# Patient Record
Sex: Female | Born: 1970 | Race: White | Hispanic: No | Marital: Married | Smoking: Never smoker
Health system: Southern US, Community
[De-identification: ages and names within clinical notes are randomized; demographics above are authoritative.]

## PROBLEM LIST (undated history)

## (undated) DIAGNOSIS — F419 Anxiety disorder, unspecified: Secondary | ICD-10-CM

## (undated) DIAGNOSIS — R011 Cardiac murmur, unspecified: Secondary | ICD-10-CM

## (undated) DIAGNOSIS — L811 Chloasma: Secondary | ICD-10-CM

## (undated) DIAGNOSIS — N390 Urinary tract infection, site not specified: Secondary | ICD-10-CM

## (undated) DIAGNOSIS — T7840XA Allergy, unspecified, initial encounter: Secondary | ICD-10-CM

## (undated) DIAGNOSIS — N920 Excessive and frequent menstruation with regular cycle: Secondary | ICD-10-CM

## (undated) DIAGNOSIS — M1990 Unspecified osteoarthritis, unspecified site: Secondary | ICD-10-CM

## (undated) DIAGNOSIS — N870 Mild cervical dysplasia: Secondary | ICD-10-CM

## (undated) HISTORY — PX: BREAST SURGERY: SHX581

## (undated) HISTORY — DX: Chloasma: L81.1

## (undated) HISTORY — PX: FOOT SURGERY: SHX648

## (undated) HISTORY — DX: Excessive and frequent menstruation with regular cycle: N92.0

## (undated) HISTORY — DX: Unspecified osteoarthritis, unspecified site: M19.90

## (undated) HISTORY — PX: TONSILLECTOMY AND ADENOIDECTOMY: SUR1326

## (undated) HISTORY — DX: Anxiety disorder, unspecified: F41.9

## (undated) HISTORY — DX: Urinary tract infection, site not specified: N39.0

## (undated) HISTORY — DX: Mild cervical dysplasia: N87.0

## (undated) HISTORY — DX: Cardiac murmur, unspecified: R01.1

## (undated) HISTORY — PX: DILATION AND EVACUATION: SHX1459

## (undated) HISTORY — DX: Allergy, unspecified, initial encounter: T78.40XA

---

## 1997-12-10 ENCOUNTER — Ambulatory Visit (HOSPITAL_BASED_OUTPATIENT_CLINIC_OR_DEPARTMENT_OTHER): Admission: RE | Admit: 1997-12-10 | Discharge: 1997-12-10 | Payer: Self-pay | Admitting: Surgery

## 2000-01-13 ENCOUNTER — Emergency Department (HOSPITAL_COMMUNITY): Admission: EM | Admit: 2000-01-13 | Discharge: 2000-01-13 | Payer: Self-pay

## 2001-03-15 ENCOUNTER — Other Ambulatory Visit: Admission: RE | Admit: 2001-03-15 | Discharge: 2001-03-15 | Payer: Self-pay | Admitting: Obstetrics and Gynecology

## 2001-09-04 ENCOUNTER — Other Ambulatory Visit: Admission: RE | Admit: 2001-09-04 | Discharge: 2001-09-04 | Payer: Self-pay | Admitting: Obstetrics and Gynecology

## 2002-01-08 ENCOUNTER — Other Ambulatory Visit: Admission: RE | Admit: 2002-01-08 | Discharge: 2002-01-08 | Payer: Self-pay | Admitting: Obstetrics and Gynecology

## 2002-03-15 ENCOUNTER — Other Ambulatory Visit: Admission: RE | Admit: 2002-03-15 | Discharge: 2002-03-15 | Payer: Self-pay | Admitting: Obstetrics and Gynecology

## 2002-07-11 ENCOUNTER — Other Ambulatory Visit: Admission: RE | Admit: 2002-07-11 | Discharge: 2002-07-11 | Payer: Self-pay | Admitting: Obstetrics and Gynecology

## 2003-02-04 ENCOUNTER — Other Ambulatory Visit: Admission: RE | Admit: 2003-02-04 | Discharge: 2003-02-04 | Payer: Self-pay | Admitting: Obstetrics and Gynecology

## 2003-07-15 ENCOUNTER — Other Ambulatory Visit: Admission: RE | Admit: 2003-07-15 | Discharge: 2003-07-15 | Payer: Self-pay | Admitting: Obstetrics and Gynecology

## 2004-08-09 ENCOUNTER — Ambulatory Visit (HOSPITAL_COMMUNITY): Admission: RE | Admit: 2004-08-09 | Discharge: 2004-08-09 | Payer: Self-pay | Admitting: Gastroenterology

## 2004-08-17 ENCOUNTER — Other Ambulatory Visit: Admission: RE | Admit: 2004-08-17 | Discharge: 2004-08-17 | Payer: Self-pay | Admitting: Addiction Medicine

## 2005-02-01 ENCOUNTER — Other Ambulatory Visit: Admission: RE | Admit: 2005-02-01 | Discharge: 2005-02-01 | Payer: Self-pay | Admitting: Gynecology

## 2005-09-02 ENCOUNTER — Inpatient Hospital Stay (HOSPITAL_COMMUNITY): Admission: AD | Admit: 2005-09-02 | Discharge: 2005-09-06 | Payer: Self-pay | Admitting: Gynecology

## 2005-10-19 ENCOUNTER — Other Ambulatory Visit: Admission: RE | Admit: 2005-10-19 | Discharge: 2005-10-19 | Payer: Self-pay | Admitting: Gynecology

## 2006-10-23 ENCOUNTER — Other Ambulatory Visit: Admission: RE | Admit: 2006-10-23 | Discharge: 2006-10-23 | Payer: Self-pay | Admitting: Gynecology

## 2007-04-11 ENCOUNTER — Ambulatory Visit (HOSPITAL_COMMUNITY): Admission: AD | Admit: 2007-04-11 | Discharge: 2007-04-11 | Payer: Self-pay | Admitting: Obstetrics and Gynecology

## 2007-04-11 ENCOUNTER — Encounter: Payer: Self-pay | Admitting: Gynecology

## 2007-04-11 IMAGING — US US OB TRANSVAGINAL
1 series · 14 of 28 positions shown · non-contrast
Comparison: None.

CLINICAL DATA: 37-year-old female with bleeding.  
TRANSVAGINAL OBSTETRICAL US:
TECHNIQUE: Transvaginal ultrasound was performed for evaluation of the gestation as well as the maternal uterus and adnexal regions.

[Series 1: us ob transvaginal · 14 of 30 slices shown]
[im 2/30]
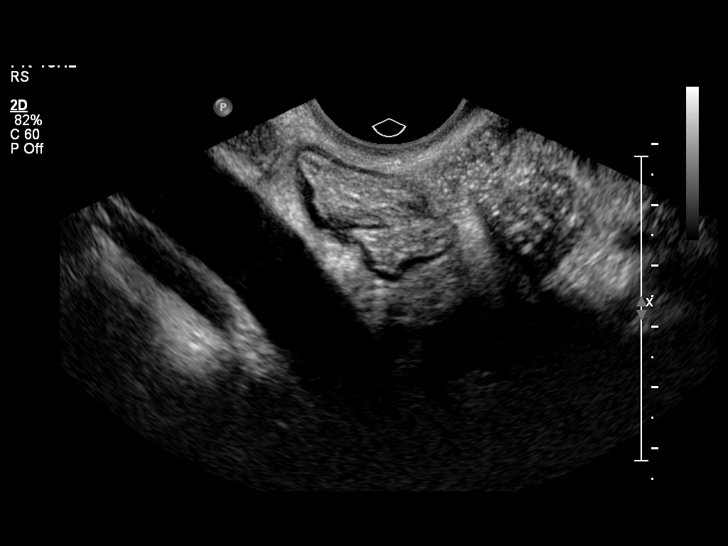
[im 4/30]
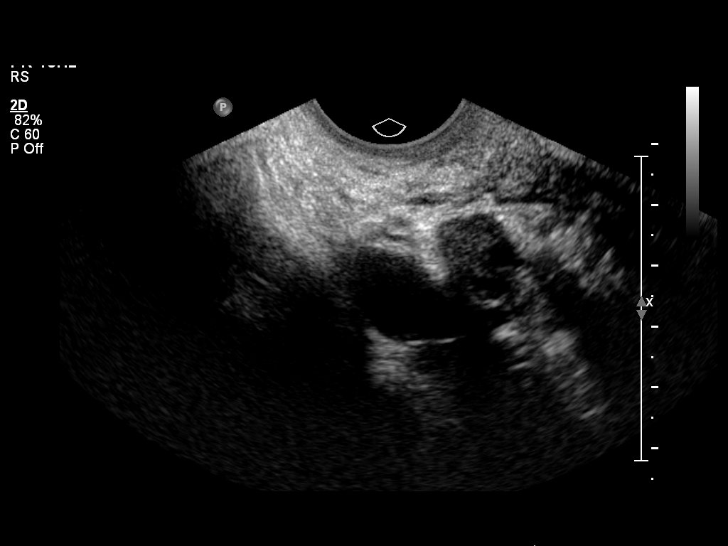
[im 6/30]
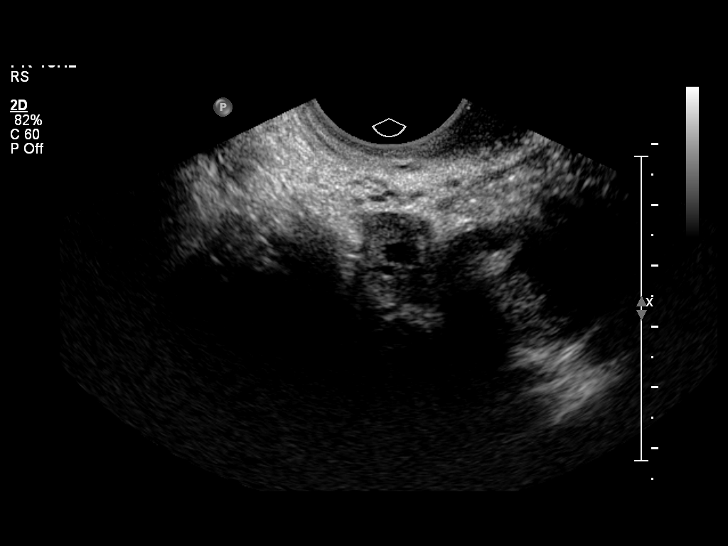
[im 8/30]
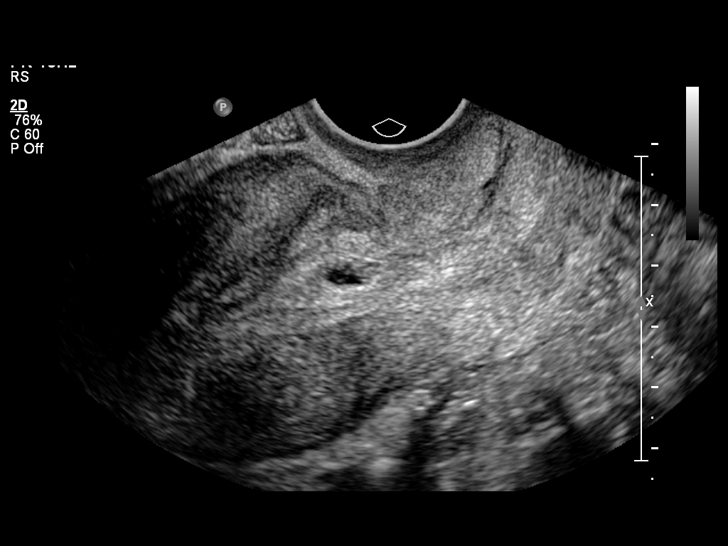
[im 10/30]
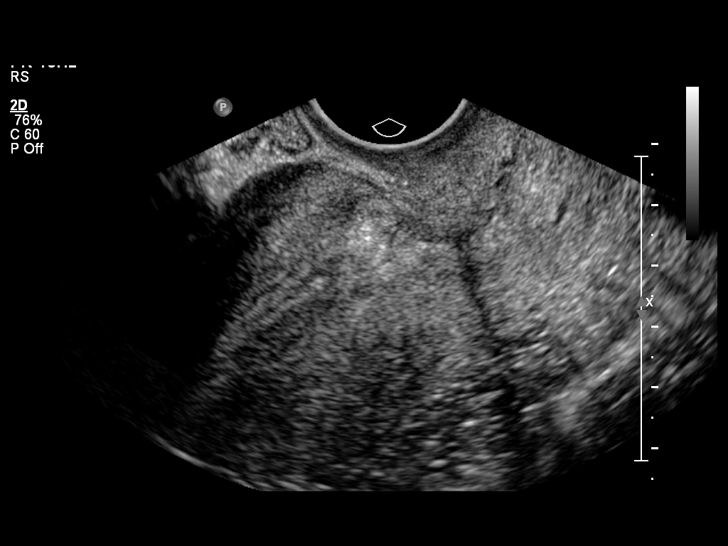
[im 12/30]
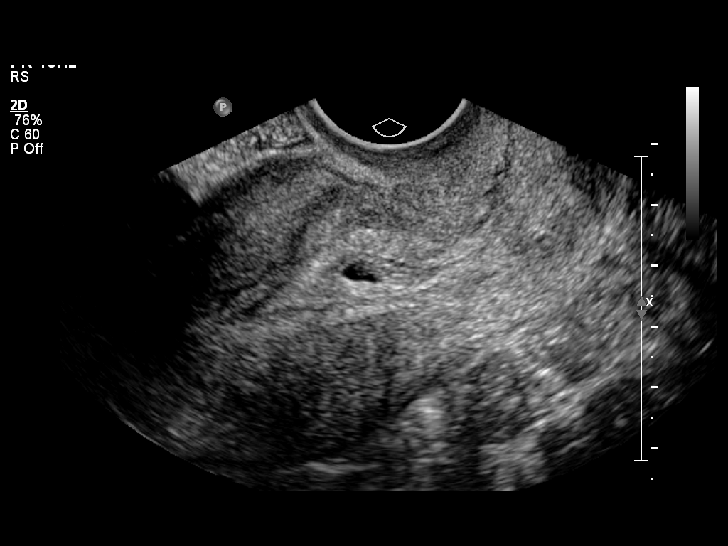
[im 14/30]
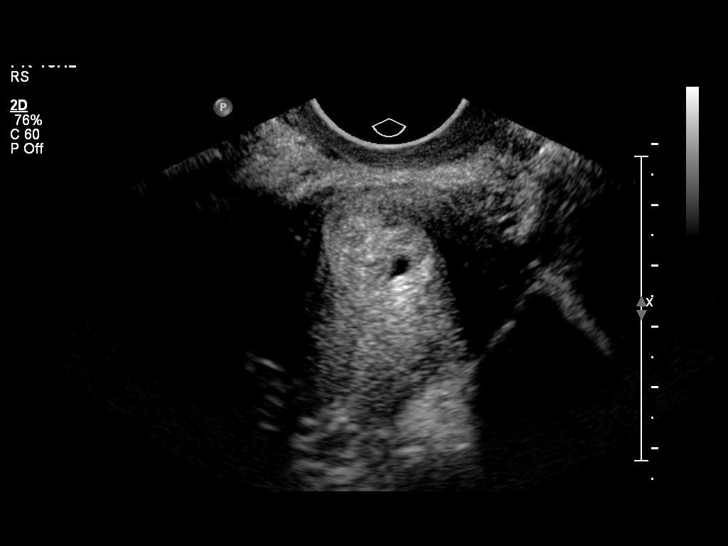
[im 17/30]
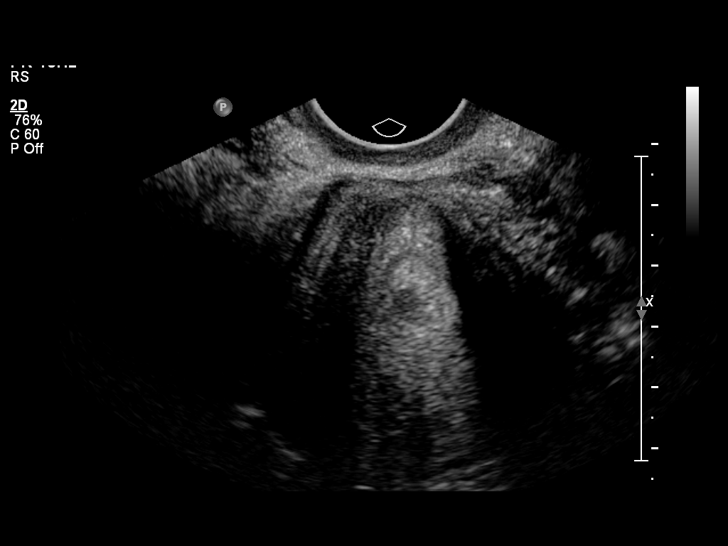
[im 19/30]
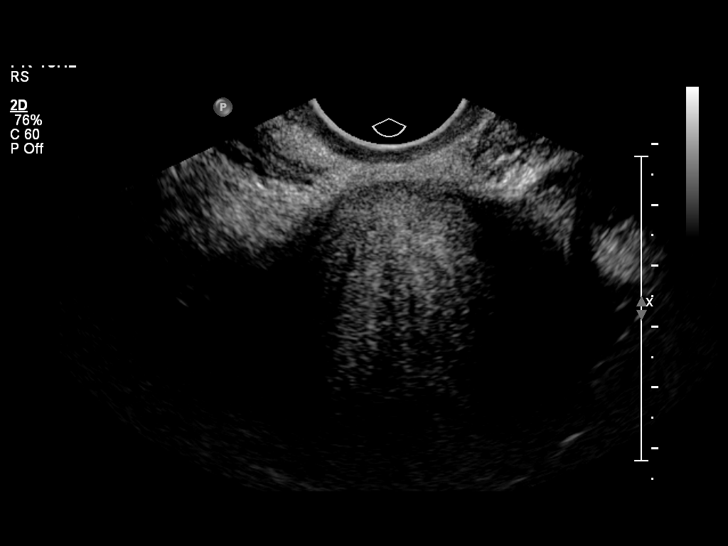
[im 21/30]
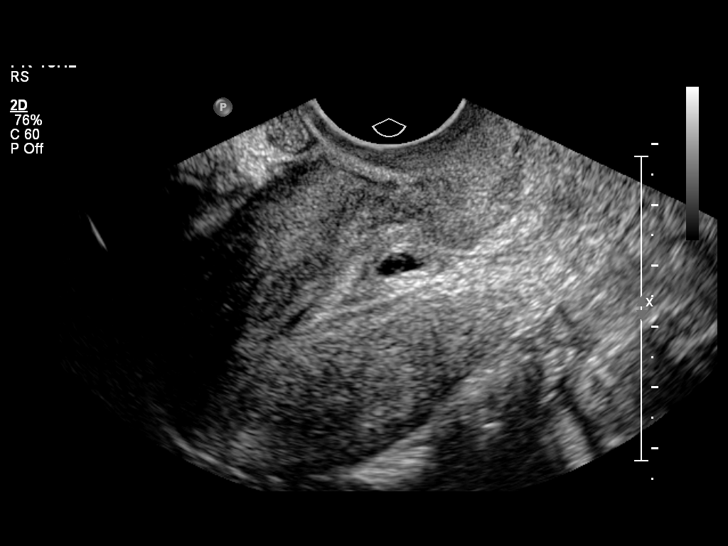
[im 23/30]
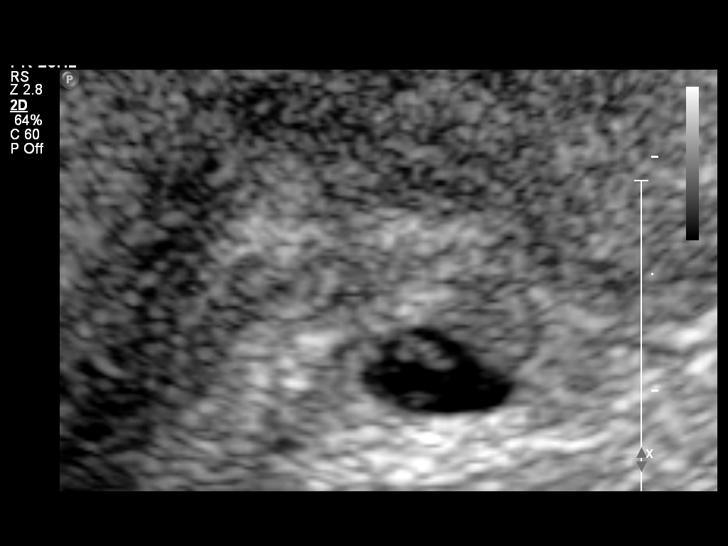
[im 25/30]
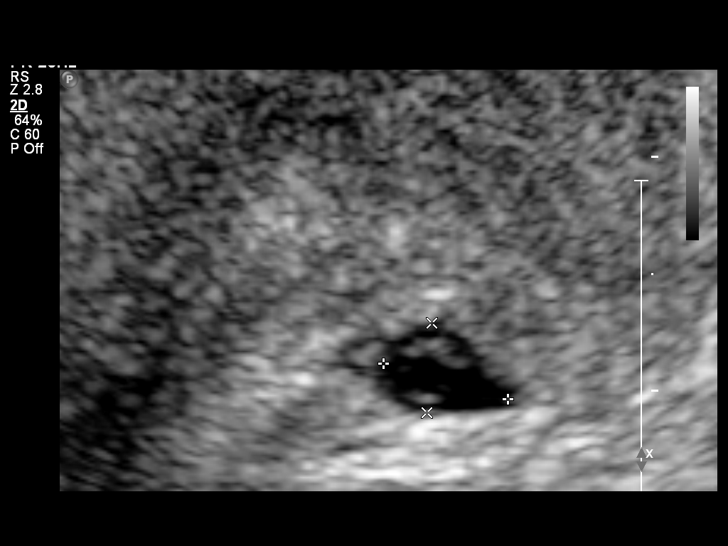
[im 27/30]
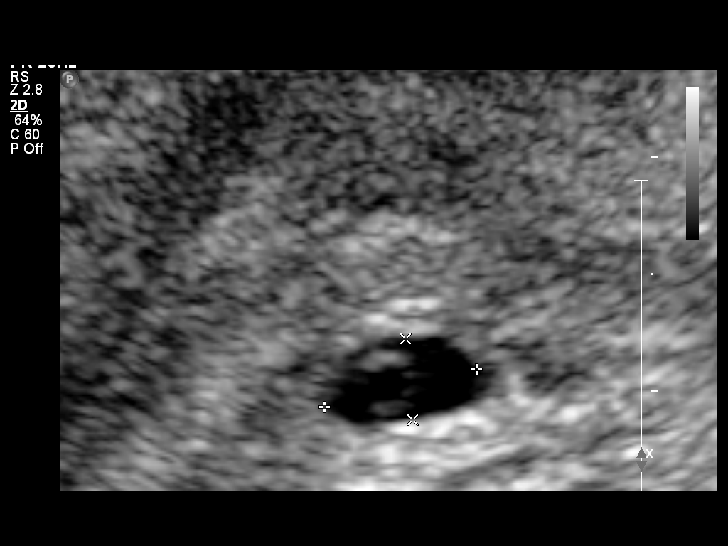
[im 30/30]
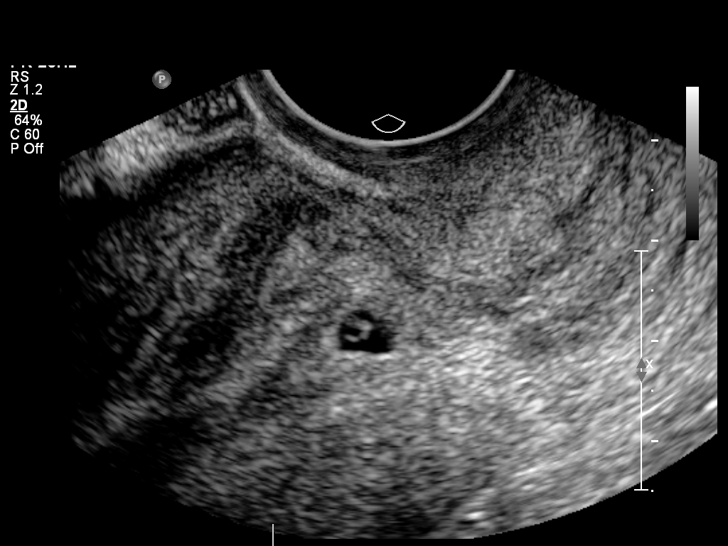

[14 of 28 positions shown; findings below may reference images not displayed]

FINDINGS: The right ovary is normal measuring 1.1 x 1.6 x 1.3 cm.  The left ovary is not visualized.  The uterus contains a single intrauterine gestational sac.  A yolk sac is identified.  Mean sac diameter is 4.5 mm correlating with an estimated gestational age of 5 weeks 0 days.  The gestational sac is located within the mid to lower uterine segment.  No embryo or cardiac activity.  No free fluid is identified.
IMPRESSION: Single intrauterine pregnancy with estimated gestational age of 5 weeks 0 days by mean sac diameter.

## 2007-10-30 ENCOUNTER — Encounter: Payer: Self-pay | Admitting: Gynecology

## 2007-10-30 ENCOUNTER — Other Ambulatory Visit: Admission: RE | Admit: 2007-10-30 | Discharge: 2007-10-30 | Payer: Self-pay | Admitting: Gynecology

## 2007-10-30 ENCOUNTER — Ambulatory Visit: Payer: Self-pay | Admitting: Gynecology

## 2008-10-16 ENCOUNTER — Ambulatory Visit: Payer: Self-pay | Admitting: Gynecology

## 2008-11-04 ENCOUNTER — Ambulatory Visit: Payer: Self-pay | Admitting: Gynecology

## 2008-11-04 ENCOUNTER — Encounter: Payer: Self-pay | Admitting: Gynecology

## 2008-11-04 ENCOUNTER — Other Ambulatory Visit: Admission: RE | Admit: 2008-11-04 | Discharge: 2008-11-04 | Payer: Self-pay | Admitting: Gynecology

## 2009-05-05 ENCOUNTER — Ambulatory Visit: Payer: Self-pay | Admitting: Gynecology

## 2009-05-05 ENCOUNTER — Other Ambulatory Visit: Admission: RE | Admit: 2009-05-05 | Discharge: 2009-05-05 | Payer: Self-pay | Admitting: Gynecology

## 2010-04-26 ENCOUNTER — Other Ambulatory Visit: Payer: Self-pay

## 2010-07-13 NOTE — Op Note (Signed)
NAME:  Gina Valentine, Gina Valentine            ACCOUNT NO.:  [id number]   MEDICAL RECORD NO.:  [id number]          PATIENT TYPE:  AMB   LOCATION:  SDC                           [facility]ILITY:  [facility]   PHYSICIAN:  Juan H. Lily Peer, M.D.DATE OF BIRTH:  1970/10/05   DATE OF PROCEDURE:  DATE OF DISCHARGE:                               OPERATIVE REPORT   HISTORY:  The patient is a 40 year old, gravida 2, para 1, now AB 1, at  7-1/2 weeks estimated gestational age who has been followed in the  office with quantitative beta-hCGs and ultrasounds.  The quantitative  beta-hCGs have been increasing but not at expected rate.  The patient  this morning presented to the emergency room with abdominal cramping and  vaginal bleeding, and was found to have tissue at the cervical os as  well as on the ultrasound.  The patient will be taken to the operating  room for D and E for a first trimester missed AB.   SURGEON:  Juan H. Lily Peer, M.D.   ANESTHESIA:  General endotracheal anesthesia.   PREOPERATIVE DIAGNOSIS:  First trimester missed abortion.   POSTOPERATIVE DIAGNOSIS:  First trimester missed abortion.   PROCEDURE:  Dilatation and evacuation.   FINDINGS:  A 6- to 8-week sized uterus, slightly anteverted, and  products of conception present at the external cervical os which was  dilated.   DESCRIPTION OF OPERATION:  After the patient was adequately counseled,  she was taken to the operative room where she underwent a MAC anesthesia  along with a paracervical block consisting of 2% lidocaine with  1:100,000 epinephrine for a total of 10 mL.  The patient was placed in  low lithotomy position.  The vagina and perineum were prepped and draped  in the usual sterile fashion.  Red Rubber Roxan Hockey was inserted into the  bladder to evacuate its contents for approximately 50 cc.  A weighted  speculum was placed into the vagina after 2% lidocaine was infiltrated  to the cervical stroma at the 2, 4, 8, and 10 o'clock  position.  The  uterus was sounded to about 8.5 cm and the cervix was serially dilated  with Shawnie Pons dilator in an effort to allow a 10 mm suction curette to be  introduced into the intrauterine cavity to remove the products of  conception.  The single-tooth tenaculum was removed.  The patient was  transferred to the recovery room with stable vital signs.  Blood loss  was minimal.  Fluid resuscitation consisted of a liter of lactated  Ringer's, and she received 1 g of  cefoxitin for prophylaxis and 10  units of Pitocin were placed in a liter of LR.  The patient's blood type  is A positive and was transferred to the recovery room with stable vital  signs.      Juan H. Lily Peer, M.D.  Electronically Signed    JHF/MEDQ  D:  04/11/2007  T:  04/11/2007  Job:  [id number]

## 2010-07-16 NOTE — Discharge Summary (Signed)
NAME:  Gina Valentine, Gina Valentine            ACCOUNT NO.:  [id number]   MEDICAL RECORD NO.:  [id number]          PATIENT TYPE:  INP   LOCATION:  9131                          [facility]ACILITY:  [facility]   PHYSICIAN:  Timothy P. Fontaine, M.D.DATE OF BIRTH:  September 20, 1970   DATE OF ADMISSION:  09/02/2005  DATE OF DISCHARGE:  09/06/2005                                 DISCHARGE SUMMARY   DISCHARGE DIAGNOSES:  1.  Pregnancy at term.  2.  Failed induction.  3.  Suspected cephalopelvic disproportion.   PROCEDURE:  Primary low transverse cervical cesarean section September 03, 2005  Dr. Reynaldo Minium.   HOSPITAL COURSE:  A 40 year old G1 P0 female at [redacted] weeks gestation admitted  for induction per Dr. Lily Peer.  Patient underwent serial induction  initially with Pitocin, subsequent Cervidil, and subsequent Pitocin the  following day.  Remained unchanged and ultimately underwent a primary low  transverse cervical cesarean section September 03, 2005 for suspected CPD, failed  induction producing a normal female infant Apgars 8/9, weight 7 pounds 13  ounces.  Patient's postoperative course was uncomplicated and she was  discharged on postoperative day #3 and ambulating well, tolerating a regular  diet with a postoperative hemoglobin of 10.8.  Patient received precautions,  instructions, and follow-up and will be seen in the office six weeks  following delivery.  Blood type A+, rubella titer positive.  Received a  prescription for Tylox #25 one to two p.o. q.4-6h. p.r.n. pain.      Timothy P. Fontaine, M.D.  Electronically Signed     TPF/MEDQ  D:  09/06/2005  T:  09/06/2005  Job:  [id number]

## 2010-07-16 NOTE — Op Note (Signed)
NAME:  Gina Valentine, Gina Valentine            ACCOUNT NO.:  [id number]   MEDICAL RECORD NO.:  [id number]          PATIENT TYPE:  INP   LOCATION:  9131                          [facility]   PHYSICIAN:  Juan H. Lily Peer, M.D.DATE OF BIRTH:  02/23/1971   DATE OF PROCEDURE:  09/03/2005  DATE OF DISCHARGE:                                 OPERATIVE REPORT   INDICATIONS FOR OPERATION:  A 40 year old gravida 1, para 0, at 40-1/2  weeks' estimated gestational age, had been brought into the hospital on July  6 for induction.  She has received Cervidil for 1 day and Pitocin x2 with  minimal change in her cervix, still remained a 1 cm 70% effaced, -3 station,  but reassuring fetal heart rate tracing.   PREOPERATIVE DIAGNOSES:  1.  Post-date pregnancy.  2.  Failed induction.  3.  Suspect cephalopelvic disproportion versus large for gestational age.   POSTOPERATIVE DIAGNOSES:  1.  Post-date pregnancy.  2.  Failed induction.  3.  Suspect cephalopelvic disproportion versus large for gestational age.   SURGEON:  Juan H. Lily Peer, M.D.   ANESTHESIA:  Epidural.   PROCEDURE PERFORMED:  Primary lower uterine segment transverse cesarean  section.   FINDINGS:  A viable female infant, Apgars of 8 and 9.  There was light  meconium in the amniotic fluid.  Weight 7 pounds 13 ounces.  Arterial cord  pH 7.30.  Normal with normal maternal pelvic anatomy.   DESCRIPTION OF OPERATION:  After the patient was adequately counseled she  was taken to the operating room, where she was redosed through her epidural  catheter.  She had a reassuring fetal heart rate tracing before taking back  to the operating room.  The abdomen was prepped and draped in the usual  sterile fashion.  A Foley catheter was already in place for monitoring of  urinary output.  A Pfannenstiel skin incision was then made 2 cm above the  symphysis pubis and the skin incision was carried down through the skin and  subcutaneous tissue down to  the rectus fascia, whereby a midline nick was  made.  The fascia was incised in transverse fashion.  The midline raphe was  entered.  The peritoneal cavity was entered cautiously.  The bladder flap  was established and the lower uterine segment was incised in a transverse  fashion.  Light meconium-stained amniotic fluid was evident.  Due to the  size of eight the baby's head in comparison to the incision, the Tender  Touch vacuum was utilized with minimal traction to assist in the delivery.  The nasopharyngeal area was then bulb suctioned with DeLee suction.  The  newborn was delivered, gave an immediate cry.  The cord was doubly clamped  and excised, shown to the parents and passed off to the neonatologists who  were in attendance, who gave the above-mentioned parameters.  After cord  blood was obtained, the placenta was delivered from the intrauterine cavity.  Pitocin was started and the patient received a gram of Ancef IV for  prophylaxis.  The uterus was then exteriorized.  The intrauterine cavity was  swept  clear of remaining products of conception.  The lower uterine segment  was closed in a double-layer fashion, first with an interlocking stitch of 0  Vicryl suture, followed by a second layer with 0 Vicryl suture in an  imbricating fashion.  After inspection of the uterus and tubes and ovaries  appeared normal and good hemostasis and sponge count and needle count was  correct, the uterus was placed in the pelvic cavity after copiously  irrigating with normal saline solution.  The visceral peritoneum was not  reapproximated, but the rectus fascia was closed with a running stitch of 0  Vicryl suture.  The subcutaneous bleeders were Bovie cauterized.  The skin  was reapproximated with skin clips, followed by placement of Xeroform gauze  and 4 x 8 dressing.  Of note, 3% Nesacaine was sprinkled into the peritoneum  due to the fact that the patient was nauseated and tender, for  approximately  20 mL.  After the skin was reapproximated with skin clips, the incision was  covered with Xeroform gauze followed by placement of 4 x 8 dressing.  The  patient was transferred to the recovery with stable vital signs.  Blood loss  for the procedure was 900 mL.  IV fluid was 2 L of LR, and urine output was  350 mL.      Juan H. Lily Peer, M.D.  Electronically Signed     JHF/MEDQ  D:  09/03/2005  T:  09/03/2005  Job:  [id number]

## 2010-07-16 NOTE — Op Note (Signed)
NAME:  Gina Valentine, Gina Valentine NO.:  [id number]   MEDICAL RECORD NO.:  [id number]          PATIENT TYPE:  AMB   LOCATION:  ENDO                         [facility]ACILITY:  [facility]   PHYSICIAN:  Graylin Shiver, M.D.   DATE OF BIRTH:  December 18, 1970   DATE OF PROCEDURE:  08/09/2004  DATE OF DISCHARGE:                                 OPERATIVE REPORT   PROCEDURE:  Colonoscopy.   INDICATIONS:  Rectal bleeding.   Informed consent was obtained after explanation of the risks of bleeding,  infection, and perforation.   PREMEDICATION:  1.  Fentanyl 75 mcg IV.  2.  Versed 6 mg IV.   PROCEDURE:  With the patient in the left lateral decubitus position, a  rectal exam was performed.  No masses were felt.  The Olympus colonoscope  was inserted into the rectum and advanced around the colon to the cecum.  Cecal landmarks were identified.  The cecum and ascending colon were normal.  The transverse colon normal.  The descending colon, sigmoid, and rectum were  normal.  The scope was retroflexed in the rectum.  No specific abnormalities  were seen.  The scope was straightened and brought out.  She tolerated the  procedure well without complications.   IMPRESSION:  Normal colonoscopy to the cecum.       SFG/MEDQ  D:  08/09/2004  T:  08/09/2004  Job:  [id number]   cc:   Reuel Boom L. Eda Paschal, M.D.  [address], [address]  [place]  [place] [address]  Fax: [phone]

## 2010-07-16 NOTE — H&P (Signed)
NAME:  Gina Valentine, Gina Valentine NO.:  [id number]   MEDICAL RECORD NO.:  [id number]          PATIENT TYPE:  MAT   LOCATION:  MATC                          [facility]   PHYSICIAN:  Juan H. Lily Peer, M.D.DATE OF BIRTH:  [birth year]   DATE OF ADMISSION:  09/01/2005  DATE OF DISCHARGE:                                HISTORY & PHYSICAL   CHIEF COMPLAINT:  Post date pregnancy.   HISTORY:  The patient is a 40 year old gravida 1, para 0, with a last  menstrual period of November 22, 2004, with estimated date of confinement  August 29, 2005, currently 40.[redacted] weeks gestation.  The patient was seen in the  office on September 01, 2005.  Her cervix was 1 cm, 50%, -3 station.  Ultrasound  demonstrated a fetus weighing 8 pounds, 8 ounces, in the vertex  presentation, with an AFI of 11.8 and cardiac activity of 143 beats per  minute.  The patient had declined first trimester screening, and declined  cystic fibrosis screening, and otherwise had an uneventful prenatal course  with the exception of an episode of epistaxis, a result of a nasal polyp, at  [redacted] weeks gestation.   PAST MEDICAL HISTORY:  The patient conceived with artificial insemination  and superovulation with __________.  She denies any allergies and no other  medical problems.   REVIEW OF SYSTEMS:  See hollister form.   PHYSICAL EXAMINATION:  VITAL SIGNS:  The patient weighs 208 pounds.  Blood  pressure 118/70.  Urine was negative for protein and glucose.  HEENT:  Unremarkable.  NECK:  Supple.  Trachea midline.  No carotid bruits.  No thyromegaly.  LUNGS:  Clear to auscultation without rhonchi or wheezes.  HEART:  Regular rate and rhythm.  No murmurs or gallops.  BREAST EXAM:  Not done.  ABDOMEN:  Soft, nontender, without rebound or guarding.  Fundal height 40  cm.  PELVIC:  Cervix 1 cm, 50% effaced, -3.  EXTREMITIES:  DTRs 1+.  Negative clonus.  Trace edema.   PRENATAL LABORATORIES:  A positive blood type.  Negative  antibody screen.  VDRL was nonreactive.  Rubella immune.  Hepatitis B surface antigen and HIV  were negative.  Alpha-fetoprotein was normal.  Diabetes screen was normal.  GBS culture was negative.  Declined cystic fibrosis screening.  Declined  first trimester screening.   ASSESSMENT:  A 40 year old, gravida 1, para 0, at 40.5 weeks estimated  gestational age, post dates at 97.[redacted] weeks gestation, being admitted on the  evening of September 01, 2005 for cervical ripening with Cervidil, followed by  Pitocin augmentation on the following morning.  Her GBS status was negative.  The risks, benefits, and pros and cons of  induction were discussed with the patient, and the potential for __________  induction were discussed, as well.  All questions were answered, and will  follow accordingly.   PLAN:  Induction with Cervidil the evening of September 01, 2005, followed by  Pitocin on the morning of September 02, 2005.      Juan H. Lily Peer, M.D.  Electronically Signed     JHF/MEDQ  D:  09/01/2005  T:  09/01/2005  Job:  [id number]

## 2010-07-19 ENCOUNTER — Other Ambulatory Visit: Payer: Self-pay | Admitting: Gynecology

## 2010-07-19 ENCOUNTER — Other Ambulatory Visit (HOSPITAL_COMMUNITY)
Admission: RE | Admit: 2010-07-19 | Discharge: 2010-07-19 | Disposition: A | Discharge status: Home / Self Care | Payer: 59 | Source: Ambulatory Visit | Attending: Gynecology | Admitting: Gynecology

## 2010-07-19 ENCOUNTER — Encounter (INDEPENDENT_AMBULATORY_CARE_PROVIDER_SITE_OTHER): Payer: 59 | Admitting: Gynecology

## 2010-07-19 DIAGNOSIS — Z13220 Encounter for screening for lipoid disorders: Secondary | ICD-10-CM

## 2010-07-19 DIAGNOSIS — R635 Abnormal weight gain: Secondary | ICD-10-CM

## 2010-07-19 DIAGNOSIS — Z124 Encounter for screening for malignant neoplasm of cervix: Secondary | ICD-10-CM | POA: Insufficient documentation

## 2010-07-19 DIAGNOSIS — Z01419 Encounter for gynecological examination (general) (routine) without abnormal findings: Secondary | ICD-10-CM

## 2010-07-19 DIAGNOSIS — N949 Unspecified condition associated with female genital organs and menstrual cycle: Secondary | ICD-10-CM

## 2010-07-21 ENCOUNTER — Other Ambulatory Visit: Payer: 59

## 2010-07-21 ENCOUNTER — Ambulatory Visit (INDEPENDENT_AMBULATORY_CARE_PROVIDER_SITE_OTHER): Payer: 59 | Admitting: Gynecology

## 2010-07-21 DIAGNOSIS — N949 Unspecified condition associated with female genital organs and menstrual cycle: Secondary | ICD-10-CM

## 2010-07-21 DIAGNOSIS — N831 Corpus luteum cyst of ovary, unspecified side: Secondary | ICD-10-CM

## 2010-07-21 DIAGNOSIS — N921 Excessive and frequent menstruation with irregular cycle: Secondary | ICD-10-CM

## 2010-11-19 LAB — DIFFERENTIAL
Basophils Relative: 0
Eosinophils Absolute: 0.1
Monocytes Absolute: 0.6
Monocytes Relative: 7
Neutrophils Relative %: 60

## 2010-11-19 LAB — CBC
MCHC: 34.1
MCV: 89.7
RBC: 3.97
RDW: 13.2

## 2010-11-19 LAB — ABO/RH: ABO/RH(D): A POS

## 2011-06-01 ENCOUNTER — Other Ambulatory Visit: Payer: Self-pay | Admitting: Physician Assistant

## 2012-05-31 ENCOUNTER — Encounter: Payer: Self-pay | Admitting: Gynecology

## 2012-06-05 ENCOUNTER — Ambulatory Visit (INDEPENDENT_AMBULATORY_CARE_PROVIDER_SITE_OTHER): Payer: 59 | Admitting: Gynecology

## 2012-06-05 ENCOUNTER — Encounter: Payer: Self-pay | Admitting: Gynecology

## 2012-06-05 VITALS — BP 126/78 | Ht 65.5 in | Wt 154.0 lb

## 2012-06-05 DIAGNOSIS — Z01419 Encounter for gynecological examination (general) (routine) without abnormal findings: Secondary | ICD-10-CM

## 2012-06-05 DIAGNOSIS — R635 Abnormal weight gain: Secondary | ICD-10-CM

## 2012-06-05 LAB — CBC WITH DIFFERENTIAL/PLATELET
Eosinophils Absolute: 0.2 10*3/uL (ref 0.0–0.7)
Eosinophils Relative: 4 % (ref 0–5)
Hemoglobin: 11.6 g/dL — ABNORMAL LOW (ref 12.0–15.0)
Lymphs Abs: 2.3 10*3/uL (ref 0.7–4.0)
MCH: 29.3 pg (ref 26.0–34.0)
MCV: 87.1 fL (ref 78.0–100.0)
Monocytes Relative: 11 % (ref 3–12)
RBC: 3.96 MIL/uL (ref 3.87–5.11)

## 2012-06-05 NOTE — Patient Instructions (Addendum)

## 2012-06-05 NOTE — Progress Notes (Signed)
Gina Valentine [birth year] [id number]   History:    42 y.o.  for annual gyn exam who is been seen the office for 2 years. 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 Her husband has had a vasectomy. She's having regular cycles. 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 Her last mammogram was in 2012 which was normal. Patient does not recall ever receiving the Tdap vaccine.  Past medical history,surgical history, family history and social history were all reviewed and documented in the EPIC chart.  Gynecologic History Patient's last menstrual period was 05/22/2012. Contraception: vasectomy Last Pap: 2012. Results were: normal Last mammogram: 2012. Results were: normal  Obstetric History OB History   Grav Para Term Preterm Abortions TAB SAB Ect Mult Living   2 1   1  1   1      # Outc Date GA Lbr Len/2nd Wgt Sex Del Anes PTL Lv   1 PAR            2 SAB                ROS: A ROS was performed and pertinent positives and negatives are included in the history.  GENERAL: No fevers or chills. HEENT: No change in vision, no earache, sore throat or sinus congestion. NECK: No pain or stiffness. CARDIOVASCULAR: No chest pain or pressure. No palpitations. PULMONARY: No shortness of breath, cough or wheeze. GASTROINTESTINAL: No abdominal pain, nausea, vomiting or diarrhea, melena or bright red blood per rectum. GENITOURINARY: No urinary frequency, urgency, hesitancy or dysuria. MUSCULOSKELETAL: No joint or muscle pain, no back pain, no recent trauma. DERMATOLOGIC: No rash, no itching, no lesions. ENDOCRINE: No polyuria, polydipsia, no heat or cold intolerance. No recent change in weight. HEMATOLOGICAL: No anemia or easy bruising or bleeding. NEUROLOGIC: No headache, seizures, numbness, tingling or weakness. PSYCHIATRIC: No depression, no loss of interest in normal activity or change in  sleep pattern.     Exam: chaperone present  BP 126/78  Ht 5' 5.5" (1.664 m)  Wt 154 lb (69.854 kg)  BMI 25.23 kg/m2  LMP 05/22/2012  Body mass index is 25.23 kg/(m^2).  General appearance : Well developed well nourished female. No acute distress HEENT: Neck supple, trachea midline, no carotid bruits, no thyroidmegaly Lungs: Clear to auscultation, no rhonchi or wheezes, or rib retractions  Heart: Regular rate and rhythm, no murmurs or gallops Breast:Examined in sitting and supine position were symmetrical in appearance, no palpable masses or tenderness,  no skin retraction, no nipple inversion, no nipple discharge, no skin discoloration, no axillary or supraclavicular lymphadenopathy Abdomen: no palpable masses or tenderness, no rebound or guarding Extremities: no edema or skin discoloration or tenderness  Pelvic:  Bartholin, Urethra, Skene Glands: Within normal limits             Vagina: No gross lesions or discharge  Cervix: No gross lesions or discharge  Uterus  anteverted, normal size, shape and consistency, non-tender and mobile  Adnexa  Without masses or tenderness  Anus and perineum  normal   Rectovaginal  normal sphincter tone without palpated masses or tenderness             Hemoccult not indicated     Assessment/Plan:  42 y.o. female for annual exam will receive her Tdap vaccine today. Literature information was provided. No Pap smear done today the new guidelines were discussed. The following  labs were ordered: CBC, screening cholesterol, hemoglobin A1c, TSH and urinalysis. She was provided with a requisition to schedule her mammogram. With her prior history of dense breasts recommended she have a 3-D mammogram. We discussed importance of calcium and vitamin D and regular exercise for osteoporosis prevention. Also discussed importance of monthly self breast examination.    Ok Edwards MD, 12:35 PM 06/05/2012

## 2012-06-06 LAB — URINALYSIS W MICROSCOPIC + REFLEX CULTURE
Bilirubin Urine: NEGATIVE
Crystals: NONE SEEN
Glucose, UA: NEGATIVE mg/dL
Specific Gravity, Urine: 1.008 (ref 1.005–1.030)
Squamous Epithelial / LPF: NONE SEEN

## 2012-06-12 ENCOUNTER — Encounter: Payer: Self-pay | Admitting: Gynecology

## 2013-03-18 ENCOUNTER — Encounter: Payer: Self-pay | Admitting: Gynecology

## 2013-11-07 ENCOUNTER — Ambulatory Visit: Payer: 59 | Admitting: Sports Medicine

## 2013-12-30 ENCOUNTER — Encounter: Payer: Self-pay | Admitting: Gynecology

## 2014-05-06 ENCOUNTER — Encounter: Payer: Self-pay | Admitting: Gynecology

## 2014-05-09 ENCOUNTER — Ambulatory Visit (INDEPENDENT_AMBULATORY_CARE_PROVIDER_SITE_OTHER): Payer: 59 | Admitting: Family Medicine

## 2014-05-09 VITALS — BP 102/60 | HR 64 | Temp 98.3°F | Resp 16 | Ht 67.0 in | Wt 143.0 lb

## 2014-05-09 DIAGNOSIS — R52 Pain, unspecified: Secondary | ICD-10-CM

## 2014-05-09 DIAGNOSIS — R509 Fever, unspecified: Secondary | ICD-10-CM

## 2014-05-09 LAB — POCT INFLUENZA A/B
Influenza A, POC: NEGATIVE
Influenza B, POC: NEGATIVE

## 2014-05-09 MED ORDER — OSELTAMIVIR PHOSPHATE 75 MG PO CAPS: 75.0000 mg | ORAL_CAPSULE | Freq: Two times a day (BID) | ORAL | Status: DC

## 2014-05-09 NOTE — Progress Notes (Signed)
[facility] [address], [place] [address] [phone]  Date:  05/09/2014   Name:  Gina Valentine   DOB:  [birth year]   MRN:  [id number]  PCP:  No primary care provider on file.    Chief Complaint: Fever; Generalized Body Aches; Sore Throat; and Nasal Congestion   History of Present Illness:  Gina Valentine is a 44 y.o. very pleasant female patient who presents with the following:  She is here today with illness. She is worried that she might be contagious and would like to have a flu test. Her daughter has been slightly ill recently but nothing severe.  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 However last night she was hot, achy this am, sore throat, headache, cough.  Her temp was 100.8 this am, better now with ibuprofen No GI symptoms.  She is generally in good health   Patient Active Problem List   Diagnosis Date Noted  . Weight gain 06/05/2012    Past Medical History  Diagnosis Date  . CIN (chronic interstitial nephritis)     CX BX 05/11/2001  . Cesarean delivery delivered   . Menorrhagia   . Migraine   . Melasma     Past Surgical History  Procedure Laterality Date  . Tonsillectomy and adenoidectomy    . Dilation and evacuation      1ST TRIMESTER MISSED AB  . Breast surgery      R BREAST FIBROADEMOMA  . Foot surgery Left     CYST REMOVED    History  Substance Use Topics  . Smoking status: Never Smoker   . Smokeless tobacco: Never Used  . Alcohol Use: Yes     Comment: occasional, socially    Family History  Problem Relation Age of Onset  . Diabetes Mother   . Hypertension Mother   . Heart disease Maternal Grandmother     No Known Allergies  Medication list has been reviewed and updated.  Current Outpatient Prescriptions on File Prior to Visit  Medication Sig Dispense Refill  . Multiple Vitamin (MULTI-VITAMIN PO) Take  by mouth.      . Omega-3 Fatty Acids (FISH OIL PO) Take by mouth.      . SUMAtriptan Succinate (IMITREX PO) Take by mouth.      . topiramate (TOPAMAX) 100 MG tablet Take 100 mg by mouth 2 (two) times daily.      . Venlafaxine HCl (EFFEXOR XR PO) Take 150 mg by mouth daily.       No current facility-administered medications on file prior to visit.    Review of Systems:  As per HPI- otherwise negative.   Physical Examination: Filed Vitals:   05/09/14 0958  BP: 102/60  Pulse: 64  Temp: 98.3 F (36.8 C)  Resp: 16   Filed Vitals:   05/09/14 0958  Height: 5\' 7"  (1.702 m)  Weight: 143 lb (64.864 kg)   Body mass index is 22.39 kg/(m^2). Ideal Body Weight: Weight in (lb) to have BMI = 25: 159.3  GEN: WDWN, NAD, Non-toxic, A & O x 3, looks well HEENT: Atraumatic, Normocephalic. Neck supple. No masses, No LAD.  Bilateral TM wnl, oropharynx normal.  PEERL,EOMI.   Ears and Nose: No external deformity. CV: RRR, No M/G/R. No JVD. No thrill. No extra heart sounds. PULM: CTA B, no wheezes, crackles, rhonchi. No retractions. No resp. distress. No  accessory muscle use. ABD: S, NT, ND EXTR: No c/c/e NEURO Normal gait.  PSYCH: Normally interactive. Conversant. Not depressed or anxious appearing.  Calm demeanor.   Results for orders placed or performed in visit on 05/09/14  POCT Influenza A/B  Result Value Ref Range   Influenza A, POC Negative    Influenza B, POC Negative      Assessment and Plan: Low grade fever - Plan: POCT Influenza A/B, oseltamivir (TAMIFLU) 75 MG capsule  Body aches - Plan: POCT Influenza A/B, oseltamivir (TAMIFLU) 75 MG capsule  Likely viral URI, but flu is possible. She had her flu shot so she may have less severe sx.  Gave an rx for tamiflu which she can take if she likes.  She will let me know if not feeling better or if any other concerns   Signed Lamar Blinks, MD

## 2014-05-09 NOTE — Patient Instructions (Signed)
You probably have a viral infection but I do not think that you have the flu at this time  However, if you would like to use tamiflu that is also ok- you take it twice a day for 5 days Use ibuprofen and/ or tylenol as needed for symptoms Let me know if you are getting worse

## 2014-05-20 ENCOUNTER — Other Ambulatory Visit (HOSPITAL_COMMUNITY)
Admission: RE | Admit: 2014-05-20 | Discharge: 2014-05-20 | Disposition: A | Discharge status: Home / Self Care | Payer: 59 | Source: Ambulatory Visit | Attending: Gynecology | Admitting: Gynecology

## 2014-05-20 ENCOUNTER — Ambulatory Visit (INDEPENDENT_AMBULATORY_CARE_PROVIDER_SITE_OTHER): Payer: 59 | Admitting: Gynecology

## 2014-05-20 ENCOUNTER — Encounter: Payer: Self-pay | Admitting: Gynecology

## 2014-05-20 VITALS — BP 122/78 | Ht 67.0 in | Wt 140.0 lb

## 2014-05-20 DIAGNOSIS — Z1151 Encounter for screening for human papillomavirus (HPV): Secondary | ICD-10-CM | POA: Diagnosis present

## 2014-05-20 DIAGNOSIS — Z01419 Encounter for gynecological examination (general) (routine) without abnormal findings: Secondary | ICD-10-CM | POA: Diagnosis present

## 2014-05-20 DIAGNOSIS — Z23 Encounter for immunization: Secondary | ICD-10-CM

## 2014-05-20 NOTE — Addendum Note (Signed)
Addended by: Thurnell Garbe A on: 05/20/2014 04:21 PM   Modules accepted: Orders, SmartSet

## 2014-05-20 NOTE — Progress Notes (Signed)
Gina Valentine [birth year] [id number]   History:    44 y.o.  for annual gyn exam with no complaints today. 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 Her husband has had a vasectomy. She's having regular cycles. 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 She received the flu vaccine today but does not recall she hasn't received the Tdap vaccine.  Past medical history,surgical history, family history and social history were all reviewed and documented in the EPIC chart.  Gynecologic History Patient's last menstrual period was 05/04/2014. Contraception: vasectomy Last Pap: 2012. Results were: normal Last mammogram: 2015. Results were: Normal but dense and had three-dimensional mammogram  Obstetric History OB History  Gravida Para Term Preterm AB SAB TAB Ectopic Multiple Living  2 1   1 1    1     # Outcome Date GA Lbr Len/2nd Weight Sex Delivery Anes PTL Lv  2 SAB           1 Para                ROS: A ROS was performed and pertinent positives and negatives are included in the history.  GENERAL: No fevers or chills. HEENT: No change in vision, no earache, sore throat or sinus congestion. NECK: No pain or stiffness. CARDIOVASCULAR: No chest pain or pressure. No palpitations. PULMONARY: No shortness of breath, cough or wheeze. GASTROINTESTINAL: No abdominal pain, nausea, vomiting or diarrhea, melena or bright red blood per rectum. GENITOURINARY: No urinary frequency, urgency, hesitancy or dysuria. MUSCULOSKELETAL: No joint or muscle pain, no back pain, no recent trauma. DERMATOLOGIC: No rash, no itching, no lesions. ENDOCRINE: No polyuria, polydipsia, no heat or cold intolerance. No recent change in weight. HEMATOLOGICAL: No anemia or easy bruising or bleeding. NEUROLOGIC: No headache,  seizures, numbness, tingling or weakness. PSYCHIATRIC: No depression, no loss of interest in normal activity or change in sleep pattern.     Exam: chaperone present  BP 122/78 mmHg  Ht 5\' 7"  (1.702 m)  Wt 140 lb (63.504 kg)  BMI 21.92 kg/m2  LMP 05/04/2014  Body mass index is 21.92 kg/(m^2).  General appearance : Well developed well nourished female. No acute distress HEENT: Eyes: no retinal hemorrhage or exudates,  Neck supple, trachea midline, no carotid bruits, no thyroidmegaly Lungs: Clear to auscultation, no rhonchi or wheezes, or rib retractions  Heart: Regular rate and rhythm, no murmurs or gallops Breast:Examined in sitting and supine position were symmetrical in appearance, no palpable masses or tenderness,  no skin retraction, no nipple inversion, no nipple discharge, no skin discoloration, no axillary or supraclavicular lymphadenopathy Abdomen: no palpable masses or tenderness, no rebound or guarding Extremities: no edema or skin discoloration or tenderness  Pelvic:  Bartholin, Urethra, Skene Glands: Within normal limits             Vagina: No gross lesions or discharge  Cervix: No gross lesions or discharge  Uterus  anteverted, normal size, shape and consistency, non-tender and mobile  Adnexa  Without masses or tenderness  Anus and perineum  normal   Rectovaginal  normal sphincter tone without palpated masses or tenderness             Hemoccult not indicated     Assessment/Plan:  44 y.o. female for annual exam who was reminded to  schedule her overdue mammogram. Pap smear with HPV screening was done today. Last Pap smear normal 2012. Patient to receive the Tdap vaccine today. Patient will return back to the office in a fasting state later this week for the following screening tests: Fasting lipid profile, TSH, conference metabolic panel, CBC, and urinalysis. Patient was reminded do her monthly breast exam. We discussed importance of calcium vitamin D and regular exercise  for osteoporosis prevention.   Terrance Mass MD, 3:08 PM 05/20/2014

## 2014-05-20 NOTE — Patient Instructions (Signed)

## 2014-05-20 NOTE — Addendum Note (Signed)
Addended by: Thurnell Garbe A on: 05/20/2014 03:37 PM   Modules accepted: Orders, SmartSet

## 2014-05-23 LAB — CYTOLOGY - PAP

## 2014-05-27 ENCOUNTER — Other Ambulatory Visit: Payer: 59

## 2014-05-27 DIAGNOSIS — Z01419 Encounter for gynecological examination (general) (routine) without abnormal findings: Secondary | ICD-10-CM

## 2014-05-27 LAB — CBC WITH DIFFERENTIAL/PLATELET
Basophils Absolute: 0.1 10*3/uL (ref 0.0–0.1)
Basophils Relative: 1 % (ref 0–1)
Eosinophils Absolute: 0.2 10*3/uL (ref 0.0–0.7)
Eosinophils Relative: 3 % (ref 0–5)
HCT: 37.4 % (ref 36.0–46.0)
Hemoglobin: 12.5 g/dL (ref 12.0–15.0)
Lymphocytes Relative: 40 % (ref 12–46)
Lymphs Abs: 2.1 10*3/uL (ref 0.7–4.0)
MCH: 29.8 pg (ref 26.0–34.0)
MCHC: 33.4 g/dL (ref 30.0–36.0)
MCV: 89.3 fL (ref 78.0–100.0)
MPV: 9.5 fL (ref 8.6–12.4)
Monocytes Absolute: 0.6 10*3/uL (ref 0.1–1.0)
Monocytes Relative: 12 % (ref 3–12)
Neutro Abs: 2.3 10*3/uL (ref 1.7–7.7)
Neutrophils Relative %: 44 % (ref 43–77)
Platelets: 311 10*3/uL (ref 150–400)
RBC: 4.19 MIL/uL (ref 3.87–5.11)
RDW: 13 % (ref 11.5–15.5)
WBC: 5.3 10*3/uL (ref 4.0–10.5)

## 2014-05-27 LAB — COMPREHENSIVE METABOLIC PANEL
ALT: 9 U/L (ref 0–35)
AST: 13 U/L (ref 0–37)
Albumin: 4 g/dL (ref 3.5–5.2)
Alkaline Phosphatase: 31 U/L — ABNORMAL LOW (ref 39–117)
BUN: 12 mg/dL (ref 6–23)
CO2: 24 mEq/L (ref 19–32)
Calcium: 8.8 mg/dL (ref 8.4–10.5)
Chloride: 106 mEq/L (ref 96–112)
Creat: 0.72 mg/dL (ref 0.50–1.10)
Glucose, Bld: 84 mg/dL (ref 70–99)
Potassium: 4.1 mEq/L (ref 3.5–5.3)
Sodium: 139 mEq/L (ref 135–145)
Total Bilirubin: 0.5 mg/dL (ref 0.2–1.2)
Total Protein: 6.6 g/dL (ref 6.0–8.3)

## 2014-05-27 LAB — LIPID PANEL
CHOLESTEROL: 133 mg/dL (ref 0–200)
HDL: 48 mg/dL (ref >=46)
LDL Cholesterol: 73 mg/dL (ref 0–99)
Total CHOL/HDL Ratio: 2.8 Ratio
Triglycerides: 60 mg/dL (ref <150)
VLDL: 12 mg/dL (ref 0–40)

## 2014-05-27 LAB — TSH: TSH: 2.044 u[IU]/mL (ref 0.350–4.500)

## 2014-05-28 LAB — URINALYSIS W MICROSCOPIC + REFLEX CULTURE
BACTERIA UA: NONE SEEN
BILIRUBIN URINE: NEGATIVE
CASTS: NONE SEEN
CRYSTALS: NONE SEEN
Glucose, UA: NEGATIVE mg/dL
HGB URINE DIPSTICK: NEGATIVE
Ketones, ur: NEGATIVE mg/dL
Leukocytes, UA: NEGATIVE
Nitrite: NEGATIVE
Protein, ur: NEGATIVE mg/dL
Specific Gravity, Urine: 1.016 (ref 1.005–1.030)
UROBILINOGEN UA: 0.2 mg/dL (ref 0.0–1.0)
pH: 7.5 (ref 5.0–8.0)

## 2014-05-30 ENCOUNTER — Telehealth: Payer: Self-pay | Admitting: *Deleted

## 2014-05-30 NOTE — Telephone Encounter (Signed)
Pt had Tdap shot on 05/20/14 c/o right arm discomfort hurts to left up and to reach around her back. Came in office for lab 05/27/14 and had the [facility] look at her arm and it appeared fine.  Pt has been taking ibuprofen to help with discomfort, states discomfort is much better. Pt asked if you have recommendations and how long can she expect the discomfort to last?

## 2014-05-30 NOTE — Telephone Encounter (Signed)
Skin sensitivity in areas where patients have received injections may very from person to person. It initially [place] all pressure or slightly tender but over the course of days and weeks and will resolve. Unless she has redness in the area and/or fever or mobility is affected we will need to see her. It is fine that she takes ibuprofen when necessary we can watch for now.

## 2014-05-30 NOTE — Telephone Encounter (Signed)
Pt informed with the below note. 

## 2014-07-14 ENCOUNTER — Encounter: Payer: Self-pay | Admitting: Gynecology

## 2015-07-20 ENCOUNTER — Other Ambulatory Visit: Payer: Self-pay | Admitting: *Deleted

## 2015-07-24 ENCOUNTER — Ambulatory Visit (INDEPENDENT_AMBULATORY_CARE_PROVIDER_SITE_OTHER): Payer: 59 | Admitting: Gynecology

## 2015-07-24 ENCOUNTER — Encounter: Payer: Self-pay | Admitting: Gynecology

## 2015-07-24 VITALS — BP 120/78 | Ht 67.0 in | Wt 135.0 lb

## 2015-07-24 DIAGNOSIS — Z87410 Personal history of cervical dysplasia: Secondary | ICD-10-CM | POA: Diagnosis not present

## 2015-07-24 DIAGNOSIS — Z01419 Encounter for gynecological examination (general) (routine) without abnormal findings: Secondary | ICD-10-CM

## 2015-07-24 LAB — LIPID PANEL
Cholesterol: 165 mg/dL (ref 125–200)
HDL: 67 mg/dL (ref >=46)
LDL CALC: 87 mg/dL (ref <130)
Total CHOL/HDL Ratio: 2.5 Ratio (ref <=5.0)
Triglycerides: 57 mg/dL (ref <150)
VLDL: 11 mg/dL (ref <30)

## 2015-07-24 LAB — COMPREHENSIVE METABOLIC PANEL
ALT: 13 U/L (ref 6–29)
AST: 17 U/L (ref 10–30)
Albumin: 4.5 g/dL (ref 3.6–5.1)
Alkaline Phosphatase: 31 U/L — ABNORMAL LOW (ref 33–115)
BILIRUBIN TOTAL: 0.5 mg/dL (ref 0.2–1.2)
BUN: 16 mg/dL (ref 7–25)
CO2: 24 mmol/L (ref 20–31)
CREATININE: 0.8 mg/dL (ref 0.50–1.10)
Calcium: 8.9 mg/dL (ref 8.6–10.2)
Chloride: 105 mmol/L (ref 98–110)
GLUCOSE: 69 mg/dL (ref 65–99)
Potassium: 4 mmol/L (ref 3.5–5.3)
SODIUM: 138 mmol/L (ref 135–146)
Total Protein: 7 g/dL (ref 6.1–8.1)

## 2015-07-24 LAB — CBC WITH DIFFERENTIAL/PLATELET
BASOS PCT: 1 %
Basophils Absolute: 43 cells/uL (ref 0–200)
EOS ABS: 172 {cells}/uL (ref 15–500)
Eosinophils Relative: 4 %
HCT: 37.4 % (ref 35.0–45.0)
Hemoglobin: 12.1 g/dL (ref 11.7–15.5)
LYMPHS PCT: 53 %
Lymphs Abs: 2279 cells/uL (ref 850–3900)
MCH: 28.2 pg (ref 27.0–33.0)
MCHC: 32.4 g/dL (ref 32.0–36.0)
MCV: 87.2 fL (ref 80.0–100.0)
MPV: 9.4 fL (ref 7.5–12.5)
Monocytes Absolute: 387 cells/uL (ref 200–950)
Monocytes Relative: 9 %
Neutro Abs: 1419 cells/uL — ABNORMAL LOW (ref 1500–7800)
Neutrophils Relative %: 33 %
PLATELETS: 268 10*3/uL (ref 140–400)
RBC: 4.29 MIL/uL (ref 3.80–5.10)
RDW: 15.7 % — AB (ref 11.0–15.0)
WBC: 4.3 10*3/uL (ref 3.8–10.8)

## 2015-07-24 LAB — TSH: TSH: 2.56 mIU/L

## 2015-07-24 MED ORDER — FLUTICASONE PROPIONATE 50 MCG/ACT NA SUSP: 1.0000 | Freq: Every day | NASAL | Status: DC

## 2015-07-24 NOTE — Progress Notes (Signed)
DIA LUC Jan 21, 1971 [id number]   History:    45 y.o.  for annual gyn exam with no complaints today. 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 Her husband has had a vasectomy. She's having regular cycles. 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   Past medical history,surgical history, family history and social history were all reviewed and documented in the EPIC chart.  Gynecologic History Patient's last menstrual period was 07/03/2015. Contraception: vasectomy Last Pap: 2016. Results were: normal Last mammogram: 2016. Results were: Normal but dense had three-dimensional mammogram  Obstetric History OB History  Gravida Para Term Preterm AB SAB TAB Ectopic Multiple Living  2 1   1 1    1     # Outcome Date GA Lbr Len/2nd Weight Sex Delivery Anes PTL Lv  2 SAB           1 Para                ROS: A ROS was performed and pertinent positives and negatives are included in the history.  GENERAL: No fevers or chills. HEENT: No change in vision, no earache, sore throat or sinus congestion. NECK: No pain or stiffness. CARDIOVASCULAR: No chest pain or pressure. No palpitations. PULMONARY: No shortness of breath, cough or wheeze. GASTROINTESTINAL: No abdominal pain, nausea, vomiting or diarrhea, melena or bright red blood per rectum. GENITOURINARY: No urinary frequency, urgency, hesitancy or dysuria. MUSCULOSKELETAL: No joint or muscle pain, no back pain, no recent trauma. DERMATOLOGIC: No rash, no itching, no lesions. ENDOCRINE: No polyuria, polydipsia, no heat or cold intolerance. No recent change in weight. HEMATOLOGICAL: No anemia or easy bruising or bleeding. NEUROLOGIC: No headache, seizures, numbness, tingling or weakness. PSYCHIATRIC: No depression, no loss of interest in normal  activity or change in sleep pattern.     Exam: chaperone present  BP 120/78 mmHg  Ht 5\' 7"  (1.702 m)  Wt 135 lb (61.236 kg)  BMI 21.14 kg/m2  LMP 07/03/2015  Body mass index is 21.14 kg/(m^2).  General appearance : Well developed well nourished female. No acute distress HEENT: Eyes: no retinal hemorrhage or exudates,  Neck supple, trachea midline, no carotid bruits, no thyroidmegaly Lungs: Clear to auscultation, no rhonchi or wheezes, or rib retractions  Heart: Regular rate and rhythm, no murmurs or gallops Breast:Examined in sitting and supine position were symmetrical in appearance, no palpable masses or tenderness,  no skin retraction, no nipple inversion, no nipple discharge, no skin discoloration, no axillary or supraclavicular lymphadenopathy Abdomen: no palpable masses or tenderness, no rebound or guarding Extremities: no edema or skin discoloration or tenderness  Pelvic:  Bartholin, Urethra, Skene Glands: Within normal limits             Vagina: No gross lesions or discharge  Cervix: No gross lesions or discharge  Uterus  anteverted, normal size, shape and consistency, non-tender and mobile  Adnexa  Without masses or tenderness  Anus and perineum  normal   Rectovaginal  normal sphincter tone without palpated masses or tenderness             Hemoccult not indicated     Assessment/Plan:  44 y.o. female for annual exam was prescribed Flonase to apply to each nostril when necessary seasonal allergy. The following screening blood work was ordered today: Comprehensive metabolic  panel, fasting lipid profile, TSH, CBC, and urinalysis. Patient was reminded to schedule her overdue mammogram. We discussed them for some monthly breast examinations. Pap smear not indicated this year.   Terrance Mass MD, 8:51 AM 07/24/2015

## 2015-07-25 LAB — URINALYSIS W MICROSCOPIC + REFLEX CULTURE
BACTERIA UA: NONE SEEN [HPF]
BILIRUBIN URINE: NEGATIVE
Casts: NONE SEEN [LPF]
Crystals: NONE SEEN [HPF]
GLUCOSE, UA: NEGATIVE
Hgb urine dipstick: NEGATIVE
KETONES UR: NEGATIVE
Nitrite: NEGATIVE
PROTEIN: NEGATIVE
RBC / HPF: NONE SEEN RBC/HPF (ref <=2)
SPECIFIC GRAVITY, URINE: 1.005 (ref 1.001–1.035)
WBC UA: NONE SEEN WBC/HPF (ref <=5)
Yeast: NONE SEEN [HPF]
pH: 7.5 (ref 5.0–8.0)

## 2015-07-26 LAB — URINE CULTURE
Colony Count: NO GROWTH
ORGANISM ID, BACTERIA: NO GROWTH

## 2015-11-20 ENCOUNTER — Encounter: Payer: Self-pay | Admitting: Gynecology

## 2016-03-09 DIAGNOSIS — R05 Cough: Secondary | ICD-10-CM | POA: Diagnosis not present

## 2016-05-13 ENCOUNTER — Encounter: Payer: Self-pay | Admitting: Gynecology

## 2016-05-13 ENCOUNTER — Ambulatory Visit (INDEPENDENT_AMBULATORY_CARE_PROVIDER_SITE_OTHER): Payer: 59 | Admitting: Gynecology

## 2016-05-13 VITALS — BP 108/64

## 2016-05-13 DIAGNOSIS — N644 Mastodynia: Secondary | ICD-10-CM

## 2016-05-13 NOTE — Progress Notes (Signed)
   Patient is a 46 year old that presented to the office stating that at the start of her last menstrual period she experience some breast tenderness on the lower aspect of her right breast slightly radiating laterally. She denies any recent injury or trauma. She denies any nipple discharge. She does not palpate any masses. She denies any family history of any breast cancer. She is on no hormones her husband has had a vasectomy. She reports normal menstrual cycles. She does consume caffeine during the day. Patient's last mammogram was normal in September 2017 she had a three-dimensional mammogram as a result of her dense breasts.  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 We discussed limited her caffeine intake to one cup of coffee per day only. I've also recommended she begin taking a vitamin E6 100 units daily to help with her symptoms. She scheduled to see me in 3 months for annual exam. If her symptoms recur we will recommend imaging studies. Her last mammogram was 6 months ago was normal.

## 2016-05-13 NOTE — Patient Instructions (Signed)
Breast Tenderness Breast tenderness is a common problem for women of all ages. Breast tenderness may cause mild discomfort to severe pain. The pain usually comes and goes in association with your menstrual cycle, but it can be constant. Breast tenderness has many possible causes, including hormone changes and some medicines. Your health care provider may order tests, such as a mammogram or an ultrasound, to check for any unusual findings. Having breast tenderness usually does not mean that you have breast cancer. Follow these instructions at home: Sometimes, reassurance that you do not have breast cancer is all that is needed. In general, follow these home care instructions: Managing pain and discomfort  If directed, apply ice to the area:  Put ice in a plastic bag.  Place a towel between your skin and the bag.  Leave the ice on for 20 minutes, 2-3 times a day.  Make sure you are wearing a supportive bra, especially during exercise. You may also want to wear a supportive bra while sleeping if your breasts are very tender. Medicines  Take over-the-counter and prescription medicines only as told by your health care provider. If the cause of your pain is infection, you may be prescribed an antibiotic medicine.  If you were prescribed an antibiotic, take it as told by your health care provider. Do not stop taking the antibiotic even if you start to feel better. General instructions  Your health care provider may recommend that you reduce the amount of fat in your diet. You can do this by:  Limiting fried foods.  Cooking foods using methods, such as baking, boiling, grilling, and broiling.  Decrease the amount of caffeine in your diet. You can do this by drinking more water and choosing caffeine-free options.  Keep a log of the days and times when your breasts are most tender.  Ask your health care provider how to do breast exams at home. This will help you notice if you have an unusual  growth or lump. Contact a health care provider if:  Any part of your breast is hard, red, and hot to the touch. This may be a sign of infection.  You are not breastfeeding and you have fluid, especially blood or pus, coming out of your nipples.  You have a fever.  You have a new or painful lump in your breast that remains after your menstrual period ends.  Your pain does not improve or it gets worse.  Your pain is interfering with your daily activities. This information is not intended to replace advice given to you by your health care provider. Make sure you discuss any questions you have with your health care provider. Document Released: 01/28/2008 Document Revised: 11/13/2015 Document Reviewed: 11/13/2015 Elsevier Interactive Patient Education  2017 [employer] Inc[employer]  

## 2016-07-13 ENCOUNTER — Encounter: Payer: Self-pay | Admitting: Gynecology

## 2016-07-15 DIAGNOSIS — G43009 Migraine without aura, not intractable, without status migrainosus: Secondary | ICD-10-CM | POA: Diagnosis not present

## 2016-07-15 DIAGNOSIS — M542 Cervicalgia: Secondary | ICD-10-CM | POA: Diagnosis not present

## 2016-09-23 ENCOUNTER — Other Ambulatory Visit: Payer: Self-pay | Admitting: Gynecology

## 2016-10-25 DIAGNOSIS — D229 Melanocytic nevi, unspecified: Secondary | ICD-10-CM | POA: Diagnosis not present

## 2016-10-25 DIAGNOSIS — L709 Acne, unspecified: Secondary | ICD-10-CM | POA: Diagnosis not present

## 2016-10-25 DIAGNOSIS — L918 Other hypertrophic disorders of the skin: Secondary | ICD-10-CM | POA: Diagnosis not present

## 2016-12-07 DIAGNOSIS — J3089 Other allergic rhinitis: Secondary | ICD-10-CM | POA: Diagnosis not present

## 2016-12-07 DIAGNOSIS — H938X2 Other specified disorders of left ear: Secondary | ICD-10-CM | POA: Diagnosis not present

## 2016-12-07 DIAGNOSIS — M2669 Other specified disorders of temporomandibular joint: Secondary | ICD-10-CM | POA: Diagnosis not present

## 2016-12-07 DIAGNOSIS — H903 Sensorineural hearing loss, bilateral: Secondary | ICD-10-CM | POA: Diagnosis not present

## 2017-01-06 DIAGNOSIS — G43009 Migraine without aura, not intractable, without status migrainosus: Secondary | ICD-10-CM | POA: Diagnosis not present

## 2017-01-06 DIAGNOSIS — M542 Cervicalgia: Secondary | ICD-10-CM | POA: Diagnosis not present

## 2017-01-27 ENCOUNTER — Ambulatory Visit (INDEPENDENT_AMBULATORY_CARE_PROVIDER_SITE_OTHER): Payer: 59 | Admitting: Obstetrics & Gynecology

## 2017-01-27 ENCOUNTER — Encounter: Payer: Self-pay | Admitting: Obstetrics & Gynecology

## 2017-01-27 VITALS — BP 112/74 | Ht 66.5 in | Wt 136.0 lb

## 2017-01-27 DIAGNOSIS — Z01419 Encounter for gynecological examination (general) (routine) without abnormal findings: Secondary | ICD-10-CM

## 2017-01-27 DIAGNOSIS — Z9189 Other specified personal risk factors, not elsewhere classified: Secondary | ICD-10-CM | POA: Diagnosis not present

## 2017-01-27 DIAGNOSIS — N943 Premenstrual tension syndrome: Secondary | ICD-10-CM

## 2017-01-27 MED ORDER — NORETHIN-ETH ESTRAD-FE BIPHAS 1 MG-10 MCG / 10 MCG PO TABS: 1.0000 | ORAL_TABLET | Freq: Every day | ORAL | 4 refills | Status: DC

## 2017-01-27 NOTE — Patient Instructions (Signed)
1. Encounter for routine gynecological examination with Papanicolaou smear of cervix Normal gynecologic exam.  Pap reflex done.  Breast exam normal.  Last screening mammogram negative September 2017.  Will schedule a screening mammogram now.  Health labs with family physician.  2. Relies on partner vasectomy for contraception   3. PMS (premenstrual syndrome) Severe PMS with breast tenderness and withdrawal migraines.  Different approaches discussed to control her progressively worsening PMS.  Decision to start on Lo Loestrin FE.  Will attempt continuous use.  Usage and risks reviewed.   Gina Valentine it was a pleasure meeting you today!  I will inform you of your results as soon as available.   Premenstrual Syndrome Premenstrual syndrome (PMS) is a group of physical, emotional, and behavioral symptoms that affect women of childbearing age. PMS starts 1-2 weeks before the start of a woman's period and goes away a few days after the period starts. It often recurs in a predictable pattern. PMS can range from mild to severe. When it is severe, it is called premenstrual dysphoric disorder (PMDD). PMS can interfere in many ways with normal daily activities. What are the causes? The cause of this condition is not known, but it seems to be related to hormone changes that happen before menstruation. What are the signs or symptoms? Symptoms of this condition often happen every month. They go away completely after your period starts. Physical symptoms include:  Bloating.  Breast pain.  Headaches.  Extreme fatigue.  Backaches.  Swelling of the hands and feet.  Weight gain.  Hot flashes.  Emotional and behavioral symptoms include:  Mood swings.  Depression.  Angry outbursts.  Irritability.  Anxiety.  Crying spells.  Food cravings or appetite changes.  Changes in sexual desire.  Confusion.  Aggression.  Social withdrawal.  Poor concentration.  How is this diagnosed? This  condition is diagnosed if symptoms of PMS:  Are present in the 5 days before your period starts.  End within 4 days after your period starts.  Happen at least 3 months in a row.  Interfere with some of your normal activities.  Other conditions that can cause some of these symptoms must be ruled out before PMS can be diagnosed. How is this treated? This condition may be treated by:  Maintaining a healthy lifestyle. This includes eating a balanced diet and exercising regularly.  Taking medicines. Medicines can help relieve symptoms such as cramps, aches, pains, headaches, and breast tenderness. Depending on the severity of the condition, your health care provider may recommend: ? Over-the-counter pain medicines. ? Prescription medicines for PMDD.  Follow these instructions at home: Eating and drinking   Eat a well-balanced diet.  Avoid caffeine and alcohol.  Limit the amount of salt and salty foods you eat. This will help lessen bloating.  Drink enough fluid to keep your urine clear or pale yellow.  Take a multivitamin if told to by your health care provider. Lifestyle  Do not use any tobacco products, such as cigarettes, chewing tobacco, and e-cigarettes. If you need help quitting, ask your health care provider.  Exercise regularly as suggested by your health care provider.  Get enough sleep.  Practice relaxation techniques.  Limit stress. Other Instructions  For 2-3 months, write down your symptoms, their severity, and how long they last. This will help your health care provider choose the best treatment for you.  Take over-the-counter and prescription medicines only as told by your health care provider.  If you are using oral contraceptive pills,  use them as told by your health care provider. This information is not intended to replace advice given to you by your health care provider. Make sure you discuss any questions you have with your health care  provider. Document Released: 02/12/2000 Document Revised: 03/18/2015 Document Reviewed: 11/14/2014 Elsevier Interactive Patient Education  [employer].

## 2017-01-27 NOTE — Progress Notes (Signed)
RUTHIA PERSON January 01, 1971 [id number]   History:    46 y.o. G2P1A1  Married.  Vasectomy.  Daughter is 72 years old and doing well.  RP:  Established patient presenting for annual gyn exam   HPI:  Menses regular normal, occasionally heavier flow, every month.  Complaining of severe PMS with breast tenderness and migraines.  Sees a neurologist for migraines but they are not controlled during the premenstrual period.  No pelvic pain.  No pain with intercourse.  Breasts currently normal.  Urine and bowel movements normal.  Body mass index 21.62.  Physically active.  Past medical history,surgical history, family history and social history were all reviewed and documented in the EPIC chart.  Gynecologic History Patient's last menstrual period was 12/30/2016. Contraception: vasectomy Last Pap: 04/2014. Results were: Negative/HPV HR neg Last mammogram: 10/2015. Results were: Neg  Obstetric History OB History  Gravida Para Term Preterm AB Living  2 1     1 1   SAB TAB Ectopic Multiple Live Births  1            # Outcome Date GA Lbr Len/2nd Weight Sex Delivery Anes PTL Lv  2 SAB           1 Para                ROS: A ROS was performed and pertinent positives and negatives are included in the history.  GENERAL: No fevers or chills. HEENT: No change in vision, no earache, sore throat or sinus congestion. NECK: No pain or stiffness. CARDIOVASCULAR: No chest pain or pressure. No palpitations. PULMONARY: No shortness of breath, cough or wheeze. GASTROINTESTINAL: No abdominal pain, nausea, vomiting or diarrhea, melena or bright red blood per rectum. GENITOURINARY: No urinary frequency, urgency, hesitancy or dysuria. MUSCULOSKELETAL: No joint or muscle pain, no back pain, no recent trauma. DERMATOLOGIC: No rash, no itching, no lesions. ENDOCRINE: No polyuria, polydipsia, no heat or cold intolerance. No recent change in weight. HEMATOLOGICAL: No anemia or easy bruising or bleeding. NEUROLOGIC: No  headache, seizures, numbness, tingling or weakness. PSYCHIATRIC: No depression, no loss of interest in normal activity or change in sleep pattern.     Exam:   BP 112/74   Ht 5' 6.5" (1.689 m)   Wt 136 lb (61.7 kg)   LMP 12/30/2016   BMI 21.62 kg/m   Body mass index is 21.62 kg/m.  General appearance : Well developed well nourished female. No acute distress HEENT: Eyes: no retinal hemorrhage or exudates,  Neck supple, trachea midline, no carotid bruits, no thyroidmegaly Lungs: Clear to auscultation, no rhonchi or wheezes, or rib retractions  Heart: Regular rate and rhythm, no murmurs or gallops Breast:Examined in sitting and supine position were symmetrical in appearance, no palpable masses or tenderness,  no skin retraction, no nipple inversion, no nipple discharge, no skin discoloration, no axillary or supraclavicular lymphadenopathy  Abdomen: no palpable masses or tenderness, no rebound or guarding Extremities: no edema or skin discoloration or tenderness  Pelvic: Vulva normal  Bartholin, Urethra, Skene Glands: Within normal limits             Vagina: No gross lesions or discharge  Cervix: No gross lesions or discharge.  Pap reflex done.  Uterus  AV, normal size, shape and consistency, non-tender and mobile  Adnexa  Without masses or tenderness  Anus and perineum  normal     Assessment/Plan:  46 y.o. female for annual exam   1. Encounter for routine gynecological examination  with Papanicolaou smear of cervix Normal gynecologic exam.  Pap reflex done.  Breast exam normal.  Last screening mammogram negative September 2017.  Will schedule a screening mammogram now.  Health labs with family physician.  2. Relies on partner vasectomy for contraception   3. PMS (premenstrual syndrome) Severe PMS with breast tenderness and withdrawal migraines.  Different approaches discussed to control her progressively worsening PMS.  Decision to start on Lo Loestrin FE.  Will attempt  continuous use.  Usage and risks reviewed.    Counseling on the above issues more than 50% for 10 minutes.  Princess Bruins MD, 12:21 PM 01/27/2017

## 2017-01-30 NOTE — Addendum Note (Signed)
Addended by: Thurnell Garbe A on: 01/30/2017 08:40 AM   Modules accepted: Orders

## 2017-01-31 LAB — PAP IG W/ RFLX HPV ASCU

## 2017-02-06 ENCOUNTER — Encounter: Payer: Self-pay | Admitting: Obstetrics & Gynecology

## 2017-02-27 ENCOUNTER — Encounter: Payer: Self-pay | Admitting: Obstetrics & Gynecology

## 2017-02-27 NOTE — Telephone Encounter (Signed)
Several different options to include continuing on the pill despite the bleeding, stopping the pill now allow for the flow and restart a new pack this Sunday as well as increasing the estrogen dose in the pill to control bleeding.  She does have a history of migraines which may increase risk with higher estrogen doses.  Also could stop the pill for now and wait until Dr Dellis Filbert returns for her recommendations.  My recommendation for now would be to stop her pill and restart a new pack Sunday and see if this does not reset her cycle.

## 2017-05-02 ENCOUNTER — Ambulatory Visit: Payer: 59 | Admitting: Allergy and Immunology

## 2017-11-23 ENCOUNTER — Encounter: Payer: Self-pay | Admitting: Obstetrics & Gynecology

## 2017-11-23 ENCOUNTER — Telehealth: Payer: Self-pay | Admitting: *Deleted

## 2017-11-23 ENCOUNTER — Ambulatory Visit: Payer: 59 | Admitting: Obstetrics & Gynecology

## 2017-11-23 ENCOUNTER — Encounter: Payer: Self-pay | Admitting: *Deleted

## 2017-11-23 VITALS — BP 126/84

## 2017-11-23 DIAGNOSIS — N6323 Unspecified lump in the left breast, lower outer quadrant: Secondary | ICD-10-CM | POA: Diagnosis not present

## 2017-11-23 NOTE — Patient Instructions (Signed)
1. Breast lump on left side at 4 o'clock position 2 cm x 1.5 cm solid, mildly tender, mobile, well-circumscribed nodule at 4:00 on the left breast.  We will proceed with a left diagnostic mammogram and ultrasound.  Findings discussed with patient who agrees with plan.  Lenette, it was a pleasure seeing you today!  You will receive a phone call to organize the left diagnostic mammogram and ultrasound.

## 2017-11-23 NOTE — Telephone Encounter (Signed)
-----   Message from Princess Bruins, MD sent at 11/23/2017  2:30 PM EDT ----- Regarding: Refer to [facility] for a Rt Dx mammo/US Left breast tender, mobile nodule 2 x 1.5 cm at 4 O'clock.

## 2017-11-23 NOTE — Telephone Encounter (Signed)
Patient scheduled on 11/30/17 @ 1:15pm at [facility] order faxed, sent my chart message with time and date.

## 2017-11-23 NOTE — Progress Notes (Signed)
    Gina Valentine 1970-08-15 [id number]        47 y.o.  G2P0011 Married.  Vasectomy.  RP: Left tender lumps x 2 felt for a few days  HPI:  C/O 2 tender lumps on left lower outer breast x a few days.  LMP 10/29/2017.  Patient has dense breasts.  H/O Rt breast excision of a Fibroadenoma/cyst aspiration.  No Fam h/o breast Ca.  Screening mammo negative in 04/2017.   OB History  Gravida Para Term Preterm AB Living  2 1     1 1   SAB TAB Ectopic Multiple Live Births  1            # Outcome Date GA Lbr Len/2nd Weight Sex Delivery Anes PTL Lv  2 SAB           1 Para             Past medical history,surgical history, problem list, medications, allergies, family history and social history were all reviewed and documented in the EPIC chart.   Directed ROS with pertinent positives and negatives documented in the history of present illness/assessment and plan.  Exam:  Vitals:   11/23/17 1410  BP: 126/84   General appearance:  Normal  Breast exam: Right breast dense but no nodule or mass felt.  Normal skin, normal nipple.  No right axillary lymph node felt.  Left breast with a 2 x 1.5 cm nodule at 4:00.  The nodule is solid, mildly tender, mobile, well-circumscribed.  Normal skin, normal nipple.  No left axillary lymph node felt.   Assessment/Plan:  47 y.o. G2P0011   1. Breast lump on left side at 4 o'clock position 2 cm x 1.5 cm solid, mildly tender, mobile, well-circumscribed nodule at 4:00 on the left breast.  We will proceed with a left diagnostic mammogram and ultrasound.  Findings discussed with patient who agrees with plan.  Counseling on above issues and coordination of care more than 50% for 15 minutes.  Princess Bruins MD, 2:15 PM 11/23/2017

## 2017-11-24 NOTE — Telephone Encounter (Signed)
Patient received message

## 2018-08-17 ENCOUNTER — Encounter: Payer: Self-pay | Admitting: Obstetrics & Gynecology

## 2019-01-08 ENCOUNTER — Other Ambulatory Visit: Payer: Self-pay

## 2019-01-08 DIAGNOSIS — Z20822 Contact with and (suspected) exposure to covid-19: Secondary | ICD-10-CM

## 2019-01-09 LAB — NOVEL CORONAVIRUS, NAA: SARS-CoV-2, NAA: NOT DETECTED

## 2019-04-18 ENCOUNTER — Encounter: Payer: 59 | Admitting: Obstetrics & Gynecology

## 2019-04-23 ENCOUNTER — Other Ambulatory Visit: Payer: Self-pay

## 2019-04-24 ENCOUNTER — Encounter: Payer: Self-pay | Admitting: Obstetrics & Gynecology

## 2019-04-24 ENCOUNTER — Ambulatory Visit (INDEPENDENT_AMBULATORY_CARE_PROVIDER_SITE_OTHER): Payer: 59 | Admitting: Obstetrics & Gynecology

## 2019-04-24 VITALS — BP 120/80 | Ht 66.5 in | Wt 149.0 lb

## 2019-04-24 DIAGNOSIS — Z9189 Other specified personal risk factors, not elsewhere classified: Secondary | ICD-10-CM

## 2019-04-24 DIAGNOSIS — Z01419 Encounter for gynecological examination (general) (routine) without abnormal findings: Secondary | ICD-10-CM | POA: Diagnosis not present

## 2019-04-24 NOTE — Progress Notes (Signed)
Gina Valentine 1970/04/09 [id number]   History:    49 y.o. G2P1L1A1  Married.  Vasectomy.  Daughter is 22 years old and doing well.  RP:  Established patient presenting for annual gyn exam   HPI:  Menses regular normal, occasionally heavier flow, every month.  Complaining of severe PMS with breast tenderness and migraines.  Sees a neurologist for migraines but they are not controlled during the premenstrual period.  No pelvic pain. No pain with intercourse.  Breasts currently normal.  Urine and bowel movements normal.  Body mass index 23.69.  Physically active.  Past medical history,surgical history, family history and social history were all reviewed and documented in the EPIC chart.  Gynecologic History Patient's last menstrual period was 04/17/2019 (within days).  Obstetric History OB History  Gravida Para Term Preterm AB Living  2 1 1   1 1   SAB TAB Ectopic Multiple Live Births  1            # Outcome Date GA Lbr Len/2nd Weight Sex Delivery Anes PTL Lv  2 SAB           1 Term              ROS: A ROS was performed and pertinent positives and negatives are included in the history.  GENERAL: No fevers or chills. HEENT: No change in vision, no earache, sore throat or sinus congestion. NECK: No pain or stiffness. CARDIOVASCULAR: No chest pain or pressure. No palpitations. PULMONARY: No shortness of breath, cough or wheeze. GASTROINTESTINAL: No abdominal pain, nausea, vomiting or diarrhea, melena or bright red blood per rectum. GENITOURINARY: No urinary frequency, urgency, hesitancy or dysuria. MUSCULOSKELETAL: No joint or muscle pain, no back pain, no recent trauma. DERMATOLOGIC: No rash, no itching, no lesions. ENDOCRINE: No polyuria, polydipsia, no heat or cold intolerance. No recent change in weight. HEMATOLOGICAL: No anemia or easy bruising or bleeding. NEUROLOGIC: No headache, seizures, numbness, tingling or weakness. PSYCHIATRIC: No depression, no loss of interest in normal  activity or change in sleep pattern.     Exam:   BP 120/80 (BP Location: Right Arm, Patient Position: Sitting, Cuff Size: Normal)   Ht 5' 6.5" (1.689 m)   Wt 149 lb (67.6 kg)   LMP 04/17/2019 (Within Days)   BMI 23.69 kg/m   Body mass index is 23.69 kg/m.  General appearance : Well developed well nourished female. No acute distress HEENT: Eyes: no retinal hemorrhage or exudates,  Neck supple, trachea midline, no carotid bruits, no thyroidmegaly Lungs: Clear to auscultation, no rhonchi or wheezes, or rib retractions  Heart: Regular rate and rhythm, no murmurs or gallops Breast:Examined in sitting and supine position were symmetrical in appearance, no palpable masses or tenderness,  no skin retraction, no nipple inversion, no nipple discharge, no skin discoloration, no axillary or supraclavicular lymphadenopathy Abdomen: no palpable masses or tenderness, no rebound or guarding Extremities: no edema or skin discoloration or tenderness  Pelvic: Vulva: Normal             Vagina: No gross lesions or discharge  Cervix: No gross lesions or discharge.  Pap reflex done.  Uterus  AV, normal size, shape and consistency, non-tender and mobile  Adnexa  Without masses or tenderness  Anus: Normal   Assessment/Plan:  49 y.o. female for annual exam   1. Encounter for routine gynecological examination with Papanicolaou smear of cervix Normal gynecologic exam.  Recommend trying primrose oil for PMS.  Pap test done today.  Breast exam normal.  Screening mammogram June 2020 was negative.  We will follow-up fasting for health labs here.  Good body mass index at 23.69.  We will continue with fitness and healthy nutrition. - CBC; Future - Comp Met (CMET); Future - TSH; Future - Lipid panel; Future - VITAMIN D 25 Hydroxy (Vit-D Deficiency, Fractures); Future  2. Relies on partner vasectomy for contraception  Princess Bruins MD, 4:41 PM 04/24/2019

## 2019-04-26 ENCOUNTER — Other Ambulatory Visit: Payer: Self-pay

## 2019-04-26 ENCOUNTER — Other Ambulatory Visit: Payer: 59

## 2019-04-26 DIAGNOSIS — Z01419 Encounter for gynecological examination (general) (routine) without abnormal findings: Secondary | ICD-10-CM

## 2019-04-26 LAB — PAP IG W/ RFLX HPV ASCU

## 2019-04-27 ENCOUNTER — Encounter: Payer: Self-pay | Admitting: Obstetrics & Gynecology

## 2019-04-27 LAB — COMPREHENSIVE METABOLIC PANEL
AG Ratio: 1.9 (calc) (ref 1.0–2.5)
ALT: 14 U/L (ref 6–29)
AST: 15 U/L (ref 10–35)
Albumin: 4 g/dL (ref 3.6–5.1)
Alkaline phosphatase (APISO): 33 U/L (ref 31–125)
BUN: 13 mg/dL (ref 7–25)
CO2: 28 mmol/L (ref 20–32)
Calcium: 8.9 mg/dL (ref 8.6–10.2)
Chloride: 103 mmol/L (ref 98–110)
Creat: 0.77 mg/dL (ref 0.50–1.10)
Globulin: 2.1 g/dL (calc) (ref 1.9–3.7)
Glucose, Bld: 87 mg/dL (ref 65–99)
Potassium: 4.1 mmol/L (ref 3.5–5.3)
Sodium: 139 mmol/L (ref 135–146)
Total Bilirubin: 0.5 mg/dL (ref 0.2–1.2)
Total Protein: 6.1 g/dL (ref 6.1–8.1)

## 2019-04-27 LAB — LIPID PANEL
Cholesterol: 153 mg/dL (ref <200)
HDL: 60 mg/dL (ref >=50)
LDL Cholesterol (Calc): 79 mg/dL (calc)
Non-HDL Cholesterol (Calc): 93 mg/dL (calc) (ref <130)
Total CHOL/HDL Ratio: 2.6 (calc) (ref <5.0)
Triglycerides: 61 mg/dL (ref <150)

## 2019-04-27 LAB — CBC
HCT: 36.6 % (ref 35.0–45.0)
Hemoglobin: 11.9 g/dL (ref 11.7–15.5)
MCH: 28 pg (ref 27.0–33.0)
MCHC: 32.5 g/dL (ref 32.0–36.0)
MCV: 86.1 fL (ref 80.0–100.0)
MPV: 9.7 fL (ref 7.5–12.5)
Platelets: 278 10*3/uL (ref 140–400)
RBC: 4.25 10*6/uL (ref 3.80–5.10)
RDW: 13.9 % (ref 11.0–15.0)
WBC: 5.3 10*3/uL (ref 3.8–10.8)

## 2019-04-27 LAB — VITAMIN D 25 HYDROXY (VIT D DEFICIENCY, FRACTURES): Vit D, 25-Hydroxy: 35 ng/mL (ref 30–100)

## 2019-04-27 LAB — TSH: TSH: 1.84 mIU/L

## 2019-04-27 NOTE — Patient Instructions (Signed)
1. Encounter for routine gynecological examination with Papanicolaou smear of cervix Normal gynecologic exam.  Recommend trying primrose oil for PMS.  Pap test done today.  Breast exam normal.  Screening mammogram June 2020 was negative.  We will follow-up fasting for health labs here.  Good body mass index at 23.69.  We will continue with fitness and healthy nutrition. - CBC; Future - Comp Met (CMET); Future - TSH; Future - Lipid panel; Future - VITAMIN D 25 Hydroxy (Vit-D Deficiency, Fractures); Future  2. Relies on partner vasectomy for contraception  Pascha, it was a pleasure seeing you today!  I will inform you of your results as soon as they are available.

## 2019-08-23 ENCOUNTER — Encounter: Payer: Self-pay | Admitting: Obstetrics & Gynecology

## 2019-12-12 ENCOUNTER — Other Ambulatory Visit: Payer: Self-pay | Admitting: General Surgery

## 2019-12-12 DIAGNOSIS — Z9189 Other specified personal risk factors, not elsewhere classified: Secondary | ICD-10-CM

## 2020-01-16 ENCOUNTER — Ambulatory Visit: Payer: 59 | Admitting: Physician Assistant

## 2020-01-16 ENCOUNTER — Other Ambulatory Visit: Payer: Self-pay | Admitting: General Surgery

## 2020-01-16 DIAGNOSIS — Z9189 Other specified personal risk factors, not elsewhere classified: Secondary | ICD-10-CM

## 2020-01-17 ENCOUNTER — Other Ambulatory Visit: Payer: 59

## 2020-02-05 ENCOUNTER — Encounter: Payer: Self-pay | Admitting: Physician Assistant

## 2020-02-05 ENCOUNTER — Other Ambulatory Visit: Payer: Self-pay

## 2020-02-05 ENCOUNTER — Ambulatory Visit: Payer: 59 | Admitting: Physician Assistant

## 2020-02-05 DIAGNOSIS — Z86018 Personal history of other benign neoplasm: Secondary | ICD-10-CM | POA: Diagnosis not present

## 2020-02-05 DIAGNOSIS — Z1283 Encounter for screening for malignant neoplasm of skin: Secondary | ICD-10-CM | POA: Diagnosis not present

## 2020-02-05 DIAGNOSIS — L700 Acne vulgaris: Secondary | ICD-10-CM | POA: Diagnosis not present

## 2020-02-05 NOTE — Progress Notes (Signed)
   Follow-Up Visit   Subjective  Gina Valentine is a 49 y.o. female who presents for the following: Annual Exam (no new concerns).   The following portions of the chart were reviewed this encounter and updated as appropriate: Tobacco  Allergies  Meds  Problems  Med Hx  Surg Hx  Fam Hx      Objective  Well appearing patient in no apparent distress; mood and affect are within normal limits.  A full examination was performed including scalp, head, eyes, ears, nose, lips, neck, chest, axillae, abdomen, back, buttocks, bilateral upper extremities, bilateral lower extremities, hands, feet, fingers, toes, fingernails, and toenails. All findings within normal limits unless otherwise noted below.  Objective  Head - Anterior (Face): No atypical nevi No signs of non-mole skin cancer.  Objective  Left Lower Back: No atypical nevi   Objective  Head - Anterior (Face): Erythematous papules and pustules with comedones    Assessment & Plan  Encounter for screening for malignant neoplasm of skin Head - Anterior (Face)  Yearly skin exam  History of dysplastic nevus Left Lower Back  observe  Acne vulgaris Head - Anterior (Face)  No treatment at this time patient would like to hold off.    I, Alyra Patty, PA-C, have reviewed all documentation's for this visit.  The documentation on 02/05/20 for the exam, diagnosis, procedures and orders are all accurate and complete.

## 2020-03-13 ENCOUNTER — Other Ambulatory Visit: Payer: Self-pay

## 2020-03-13 ENCOUNTER — Ambulatory Visit
Admission: RE | Admit: 2020-03-13 | Discharge: 2020-03-13 | Disposition: A | Discharge status: Home / Self Care | Payer: No Typology Code available for payment source | Source: Ambulatory Visit | Attending: General Surgery | Admitting: General Surgery

## 2020-03-13 ENCOUNTER — Ambulatory Visit: Payer: 59 | Admitting: Physician Assistant

## 2020-03-13 DIAGNOSIS — Z9189 Other specified personal risk factors, not elsewhere classified: Secondary | ICD-10-CM

## 2020-03-13 IMAGING — MR MR BREAST WO/W CM  BILAT
5 series · 30 of 48 positions shown · IV contrast (gadavist)
Comparison: None.

CLINICAL DATA: Abbreviated Breast MRI for breast cancer screening.

LABS:  None.
EXAM:
BILATERAL ABBREVIATED BREAST MRI WITH AND WITHOUT CONTRAST
TECHNIQUE: Multiplanar, multisequence MR images of both breasts were obtained
prior to and following the intravenous administration of 6 ml of
Gadavist

[Series 2: t2_tirm_tra ipat (a-p) · axial · 3.0mm · 0.70mm/px · z∈[-85,+86]mm · 5 of 57 slices shown]
[im 1/57]
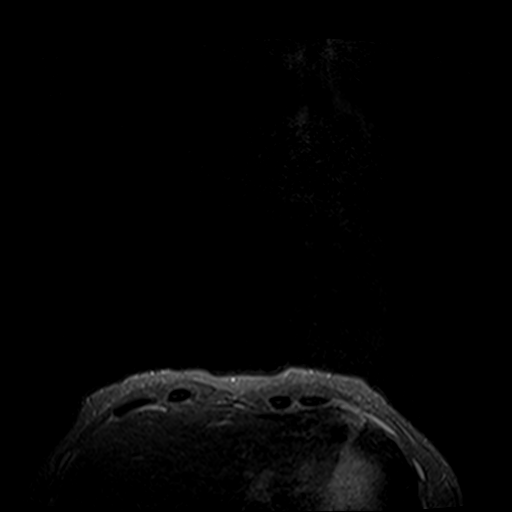
[im 15/57]
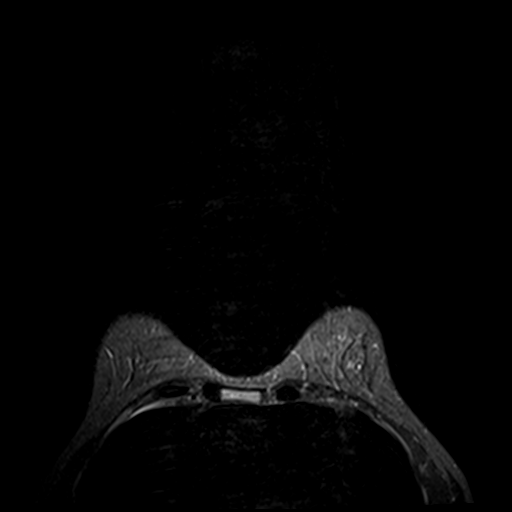
[im 29/57]
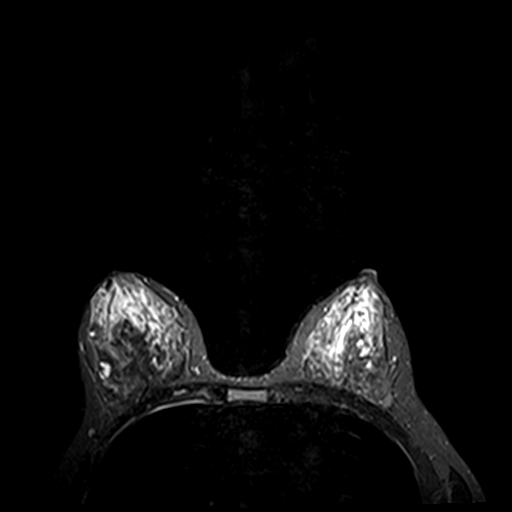
[im 43/57]
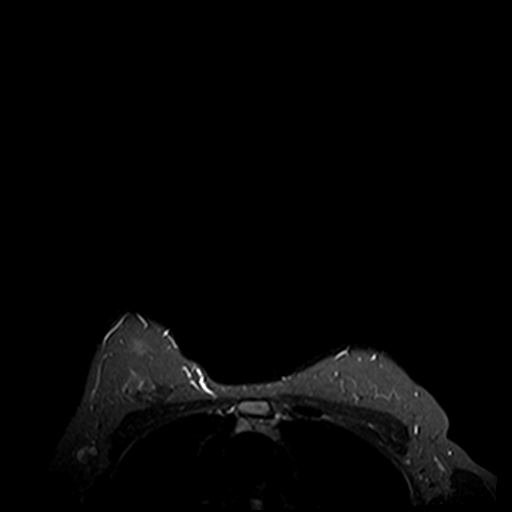
[im 57/57]
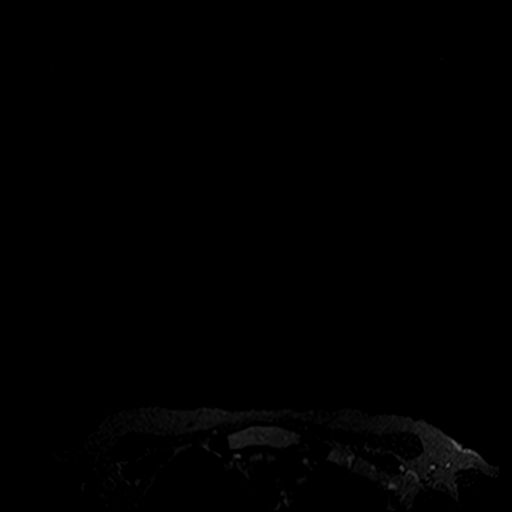

[Series 3: fl3d pre-cm · axial · non-contrast · 1.2mm · 0.94mm/px · z∈[-94,+97]mm · 8 of 160 slices shown]
[im 1/160]
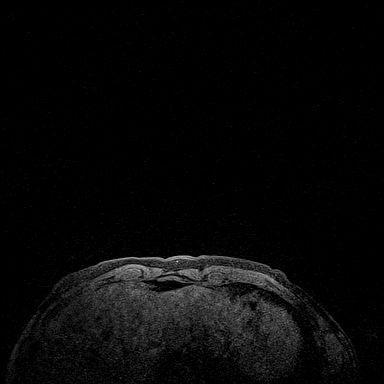
[im 25/160]
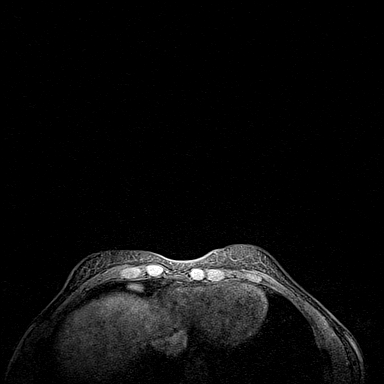
[im 49/160]
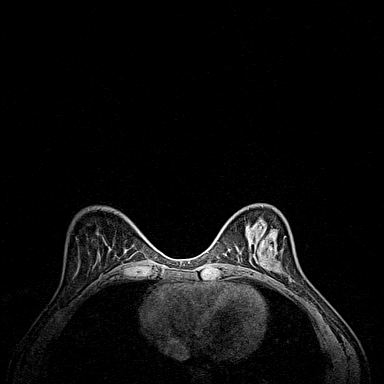
[im 74/160]
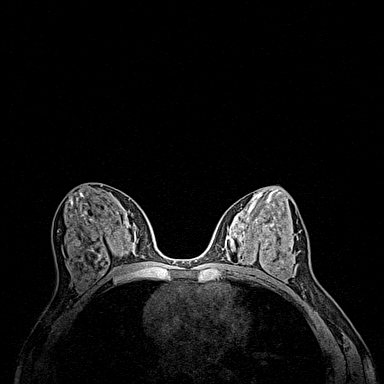
[im 86/160]
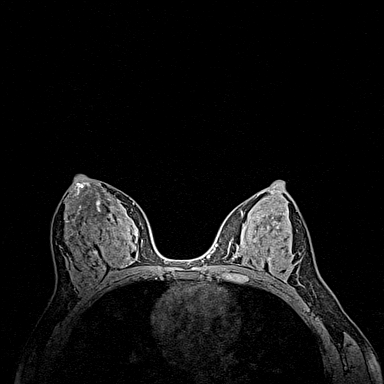
[im 111/160]
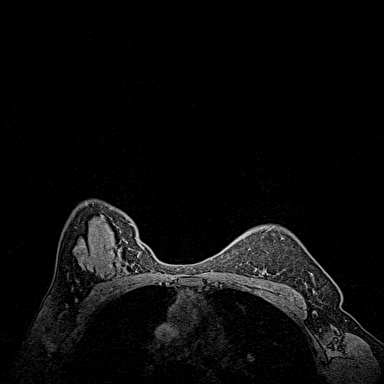
[im 135/160]
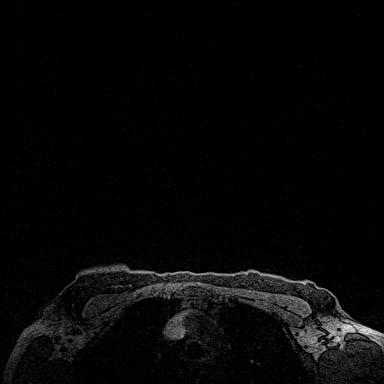
[im 160/160]
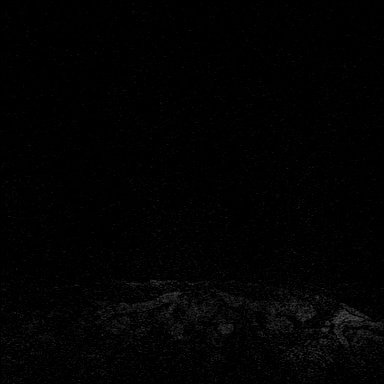

[Series 4: fl3d post-cm 20 · axial · 1.2mm · 0.94mm/px · z∈[-94,+97]mm · 8 of 160 slices shown (1 of 3)]
[im 1/160]
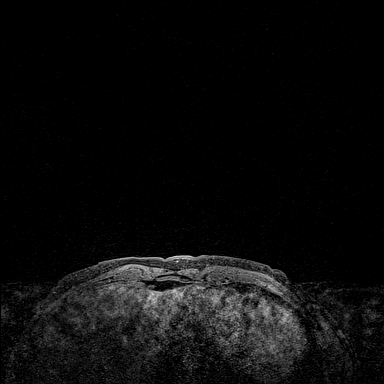
[im 25/160]
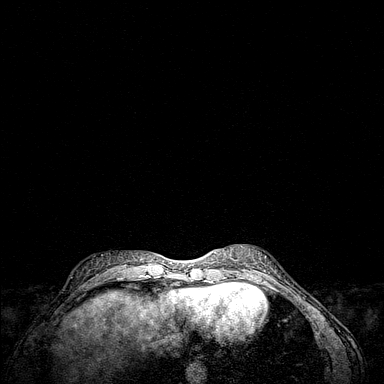
[im 49/160]
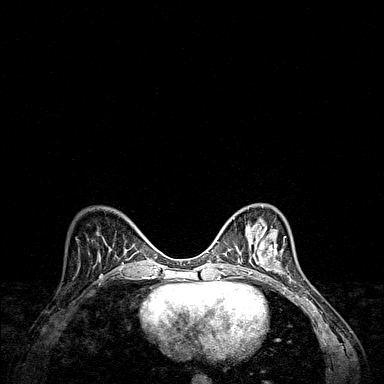
[im 74/160]
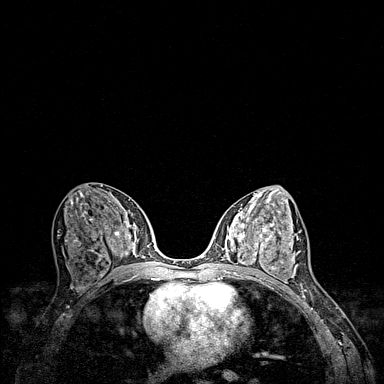
[im 86/160]
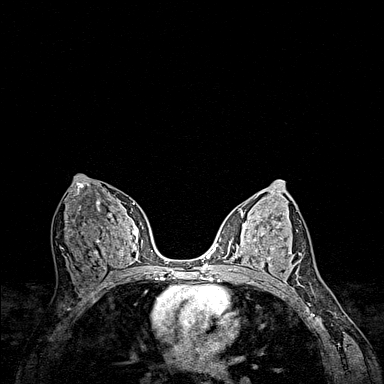
[im 111/160]
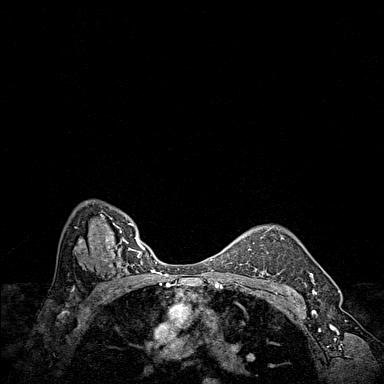
[im 135/160]
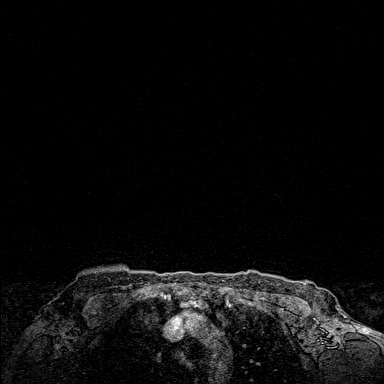
[im 160/160]
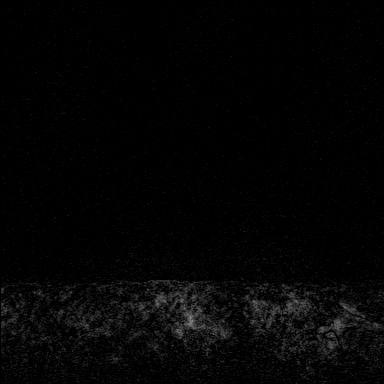

[Series 5: fl3d post-cm 20 · axial · 1.2mm · 0.94mm/px · z∈[-94,+97]mm · 8 of 160 slices shown (2 of 3)]
[im 1/160]
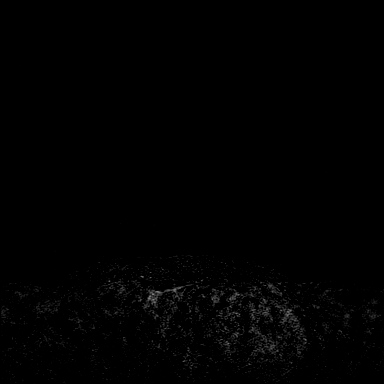
[im 25/160]
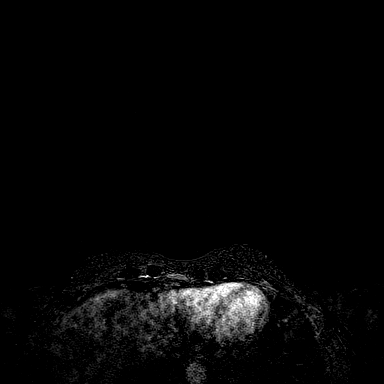
[im 49/160]
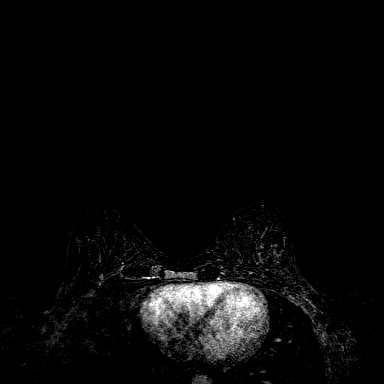
[im 74/160]
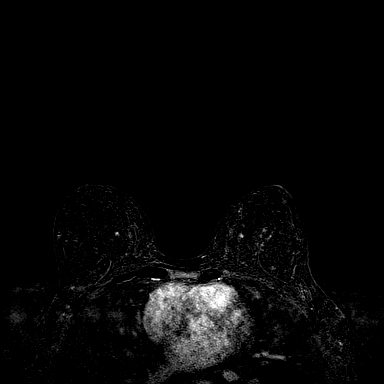
[im 86/160]
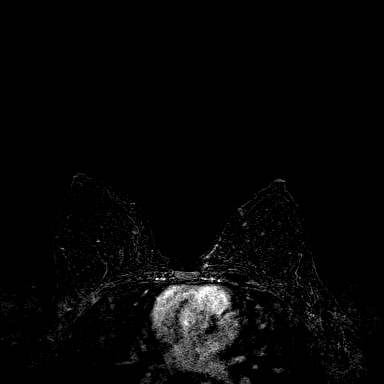
[im 111/160]
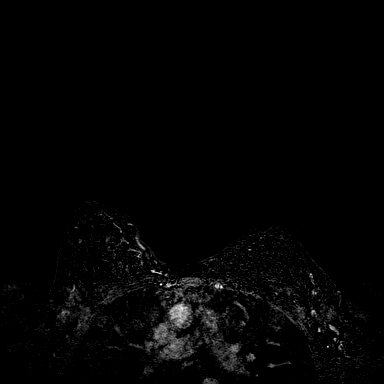
[im 135/160]
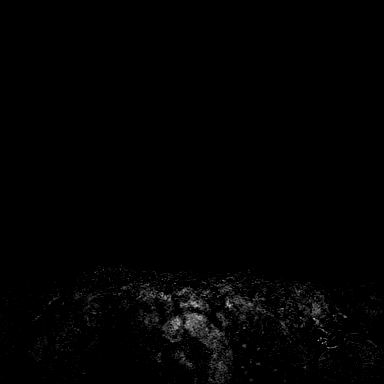
[im 160/160]
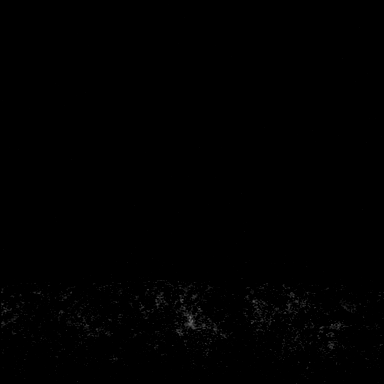

[Series 6: fl3d post-cm 20 · axial · 192.0mm · 0.94mm/px · 1 of 1 slices shown (3 of 3)]
[im 1/1]
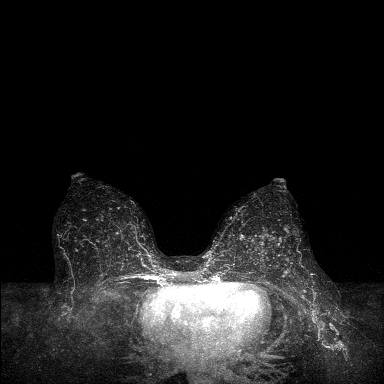

[30 of 48 positions shown; findings below may reference images not displayed]

Three-dimensional MR images were rendered by post-processing of the
original MR data on an independent workstation. The
three-dimensional MR images were interpreted, and findings are
reported in the following complete MRI report for this study. Three
dimensional images were evaluated at the independent [employer]
workstation.
FINDINGS: Breast composition: d. Extreme fibroglandular tissue.

Background parenchymal enhancement: Moderate.

Right breast: No mass or abnormal enhancement.

Left breast: No mass or abnormal enhancement.

Lymph nodes: No abnormal appearing lymph nodes.

Ancillary findings:  None.
IMPRESSION: No MRI evidence of malignancy in either breast.

RECOMMENDATION:
Recommend annual screening mammography. The patient is also eligible
for annual Abbreviated Breast MRI if she wishes.

BI-RADS CATEGORY  1: Negative.

## 2020-03-13 MED ORDER — GADOBUTROL 1 MMOL/ML IV SOLN
6.0000 mL | Freq: Once | INTRAVENOUS | Status: AC | PRN
  Administered 2020-03-13: 6 mL via INTRAVENOUS

## 2020-09-16 ENCOUNTER — Encounter: Payer: Self-pay | Admitting: Obstetrics & Gynecology

## 2020-11-27 ENCOUNTER — Encounter: Payer: Self-pay | Admitting: Obstetrics & Gynecology

## 2020-11-27 ENCOUNTER — Other Ambulatory Visit: Payer: Self-pay

## 2020-11-27 ENCOUNTER — Ambulatory Visit: Payer: 59 | Admitting: Obstetrics & Gynecology

## 2020-11-27 ENCOUNTER — Ambulatory Visit (INDEPENDENT_AMBULATORY_CARE_PROVIDER_SITE_OTHER): Payer: 59 | Admitting: Obstetrics & Gynecology

## 2020-11-27 VITALS — BP 114/78 | HR 78 | Resp 16 | Ht 66.5 in | Wt 154.0 lb

## 2020-11-27 DIAGNOSIS — Z9189 Other specified personal risk factors, not elsewhere classified: Secondary | ICD-10-CM | POA: Diagnosis not present

## 2020-11-27 DIAGNOSIS — Z01419 Encounter for gynecological examination (general) (routine) without abnormal findings: Secondary | ICD-10-CM

## 2020-11-27 DIAGNOSIS — N920 Excessive and frequent menstruation with regular cycle: Secondary | ICD-10-CM

## 2020-11-27 NOTE — Progress Notes (Signed)
Gina Valentine 1970/05/10 [id number]   History:    50 y.o.  [id number]  Married.  Vasectomy.  Daughter is 45 years old and doing well.   RP:  Established patient presenting for annual gyn exam    HPI:  Menses regular heavy every month.  No BTB.  No pelvic pain.  Complaining of severe PMS with breast tenderness and migraines.  Sees a neurologist for migraines but they are not controlled during the premenstrual period.  No pelvic pain. No pain with intercourse.  Breasts currently normal.  Screening mammo 08/2020, Lt Dx mammo/US 08/2020 Benign.  Urine and bowel movements normal.  Body mass index 24.48.  Physically active.  Health labs with family physician.    Past medical history,surgical history, family history and social history were all reviewed and documented in the EPIC chart.  Gynecologic History Patient's last menstrual period was 11/22/2020 (exact date).   Obstetric History OB History  Gravida Para Term Preterm AB Living  2 1 1   1 1   SAB IAB Ectopic Multiple Live Births  1            # Outcome Date GA Lbr Len/2nd Weight Sex Delivery Anes PTL Lv  2 SAB           1 Term              ROS: A ROS was performed and pertinent positives and negatives are included in the history.  GENERAL: No fevers or chills. HEENT: No change in vision, no earache, sore throat or sinus congestion. NECK: No pain or stiffness. CARDIOVASCULAR: No chest pain or pressure. No palpitations. PULMONARY: No shortness of breath, cough or wheeze. GASTROINTESTINAL: No abdominal pain, nausea, vomiting or diarrhea, melena or bright red blood per rectum. GENITOURINARY: No urinary frequency, urgency, hesitancy or dysuria. MUSCULOSKELETAL: No joint or muscle pain, no back pain, no recent trauma. DERMATOLOGIC: No rash, no itching, no lesions. ENDOCRINE: No polyuria, polydipsia, no heat or cold intolerance. No recent change in weight. HEMATOLOGICAL: No anemia or easy bruising or bleeding. NEUROLOGIC: No headache,  seizures, numbness, tingling or weakness. PSYCHIATRIC: No depression, no loss of interest in normal activity or change in sleep pattern.     Exam:   BP 114/78   Pulse 78   Resp 16   Ht 5' 6.5" (1.689 m)   Wt 154 lb (69.9 kg)   LMP 11/22/2020 (Exact Date) Comment: husband vasectomy  BMI 24.48 kg/m   Body mass index is 24.48 kg/m.  General appearance : Well developed well nourished female. No acute distress HEENT: Eyes: no retinal hemorrhage or exudates,  Neck supple, trachea midline, no carotid bruits, no thyroidmegaly Lungs: Clear to auscultation, no rhonchi or wheezes, or rib retractions  Heart: Regular rate and rhythm, no murmurs or gallops Breast:Examined in sitting and supine position were symmetrical in appearance, no palpable masses or tenderness,  no skin retraction, no nipple inversion, no nipple discharge, no skin discoloration, no axillary or supraclavicular lymphadenopathy Abdomen: no palpable masses or tenderness, no rebound or guarding Extremities: no edema or skin discoloration or tenderness  Pelvic: Vulva: Normal             Vagina: No gross lesions or discharge  Cervix: No gross lesions or discharge  Uterus  AV, normal size, shape and consistency, non-tender and mobile  Adnexa  Without masses or tenderness  Anus: Normal   Assessment/Plan:  50 y.o. female for annual exam   1. Well female exam with  routine gynecological exam Normal gynecologic exam.  Pap test negative in September 2021, will repeat at 2 to 3 years.  Breast exam normal.  Screening mammogram on the right and diagnostic mammogram with ultrasound on the Left breast was benign in July 2022 at [place].  Will schedule colonoscopy.  Health labs with family physician.  Good body mass index at 24.48.  Continue with fitness and healthy nutrition.  2. Relies on partner vasectomy for contraception  3. Menorrhagia with regular cycle Very heavy menstrual periods with PMS.  Counseling on contraceptives and  hormonal treatment to improve heavy menstrual periods done.  Decision to proceed with Mirena IUD insertion.  Usage, benefits and risks of Mirena IUD thoroughly reviewed with patient.  Insertion procedure reviewed.  Follow-up Mirena IUD insertion. - IUD Insertion; Future  Other orders - hydrOXYzine (ATARAX/VISTARIL) 25 MG tablet; SMARTSIG:0.5-1 Tablet(s) By Mouth 1-3 Times Daily PRN - Magnesium 125 MG CAPS - methocarbamol (ROBAXIN) 750 MG tablet; Take 750 mg by mouth 4 (four) times daily as needed. - Misc Natural Products (OSTEO BI-FLEX/5-LOXIN ADVANCED) TABS - promethazine (PHENERGAN) 25 MG tablet; Take 25 mg by mouth every 6 (six) hours as needed. - NURTEC 75 MG TBDP; Take by mouth every other day. - SUMAtriptan (IMITREX) 100 MG tablet; SMARTSIG:1 Tablet(s) By Mouth Every 2 Hours PRN - triamterene-hydrochlorothiazide (DYAZIDE) 37.5-25 MG capsule; Take 1 capsule by mouth daily. - Multiple Vitamins-Minerals (ZINC PO); Take by mouth. - Cholecalciferol (VITAMIN D3 PO); Take by mouth. - CALCIUM PO; Take by mouth. - Omega-3 Fatty Acids (FISH OIL PO); Take by mouth. - UNABLE TO FIND; Med Name: allergy med   Princess Bruins MD, 10:12 AM 11/27/2020

## 2020-12-23 ENCOUNTER — Other Ambulatory Visit: Payer: Self-pay

## 2020-12-23 ENCOUNTER — Ambulatory Visit (INDEPENDENT_AMBULATORY_CARE_PROVIDER_SITE_OTHER): Payer: 59 | Admitting: Obstetrics & Gynecology

## 2020-12-23 ENCOUNTER — Encounter: Payer: Self-pay | Admitting: Obstetrics & Gynecology

## 2020-12-23 VITALS — BP 114/70

## 2020-12-23 DIAGNOSIS — Z30430 Encounter for insertion of intrauterine contraceptive device: Secondary | ICD-10-CM | POA: Diagnosis not present

## 2020-12-23 DIAGNOSIS — N920 Excessive and frequent menstruation with regular cycle: Secondary | ICD-10-CM | POA: Diagnosis not present

## 2020-12-23 NOTE — Progress Notes (Addendum)
    Gina Valentine 1970/10/29 [id number]        50 y.o.  G2P1011 Husband vasectomized  RP: Mirena IUD insertion for Menorrhagia  HPI: Heavy menses with clots and cramps every month.  Currently menstruating.     OB History  Gravida Para Term Preterm AB Living  2 1 1   1 1   SAB IAB Ectopic Multiple Live Births  1            # Outcome Date GA Lbr Len/2nd Weight Sex Delivery Anes PTL Lv  2 SAB           1 Term             Past medical history,surgical history, problem list, medications, allergies, family history and social history were all reviewed and documented in the EPIC chart.   Directed ROS with pertinent positives and negatives documented in the history of present illness/assessment and plan.  Exam:  Vitals:   12/23/20 1040  BP: 114/70   General appearance:  Normal                                                                    IUD procedure note       Patient presented to the office today for placement of Mirena IUD. The patient had previously been provided with literature information on this method of contraception. The risks benefits and pros and cons were discussed and all her questions were answered. She is fully aware that this form of contraception is 99% effective and is good for 7 years.  Pelvic exam: Vulva normal Vagina: No lesions or discharge Cervix: No lesions or discharge Uterus: Mildly Retroverted position Adnexa: No masses or tenderness Rectal exam: Not done  The cervix was cleansed with Betadine solution. Hurricane spray on the cervix.  A single-tooth tenaculum was placed on the anterior cervical lip.  Os finder dilation of cervix, mildly RV uterus and hysterometry at 7 cm.  The IUD was shown to the patient and inserted in a sterile fashion.  The IUD string was trimmed. The single-tooth tenaculum was removed. Patient was instructed to return back to the office in one month for follow up.         Assessment/Plan:  50 y.o. G2P1011   1.  Menorrhagia with regular cycle Counseling on Menorrhagia done.  Mirena insertion procedure explained.  No difficulty or Cx with insertion of the IUD.  Well tolerated.  Post procedure precautions reviewed.  F/U 4 wks for IUD check. - IUD Insertion  2. Encounter for IUD insertion  As above.  Princess Bruins MD, 11:07 AM 12/23/2020

## 2020-12-25 ENCOUNTER — Encounter: Payer: Self-pay | Admitting: Obstetrics & Gynecology

## 2020-12-25 ENCOUNTER — Ambulatory Visit: Payer: 59 | Admitting: Obstetrics & Gynecology

## 2021-01-29 ENCOUNTER — Telehealth: Payer: Self-pay | Admitting: *Deleted

## 2021-01-29 NOTE — Telephone Encounter (Signed)
Patient called stating Dr. Collene Mares office requires referral for colonscopy. I faxed order for patient to have this done. Patient aware to reach out to that office.

## 2021-02-01 ENCOUNTER — Encounter: Payer: Self-pay | Admitting: Obstetrics & Gynecology

## 2021-02-01 ENCOUNTER — Ambulatory Visit: Payer: 59 | Admitting: Obstetrics & Gynecology

## 2021-02-01 ENCOUNTER — Other Ambulatory Visit: Payer: Self-pay

## 2021-02-01 VITALS — BP 118/76

## 2021-02-01 DIAGNOSIS — Z0000 Encounter for general adult medical examination without abnormal findings: Secondary | ICD-10-CM

## 2021-02-01 DIAGNOSIS — Z30431 Encounter for routine checking of intrauterine contraceptive device: Secondary | ICD-10-CM | POA: Diagnosis not present

## 2021-02-01 NOTE — Progress Notes (Signed)
    Gina Valentine [birth year] [id number]        50 y.o.  G2P1011   RP: IUD check post insertion 12/23/20  HPI: Mirena IUD insertion 12/23/2020 for Menorrhagia.  Since then, had BTB most days.  Difficult to say when her menstrual period was, but probably last week and now bleeding much less.  No pelvic pain.  No abnormal vaginal d/c.  No fever.   OB History  Gravida Para Term Preterm AB Living  $Remov'2 1 1   1 1  '[id number]$ SAB IAB Ectopic Multiple Live Births  1            # Outcome Date GA Lbr Len/2nd Weight Sex Delivery Anes PTL Lv  2 SAB           1 Term             Past medical history,surgical history, problem list, medications, allergies, family history and social history were all reviewed and documented in the EPIC chart.   Directed ROS with pertinent positives and negatives documented in the history of present illness/assessment and plan.  Exam:  Vitals:   02/01/21 1018  BP: 118/76   General appearance:  Normal  Abdomen: Normal  Gynecologic exam: Vulva normal.  Speculum:  Cervix/Vagina normal.  IUD strings short/visible at EO.  No erythema at the cervix.  Minimal brown blood.  No discharge.   Assessment/Plan:  50 y.o. G2P1011   1. Encounter for routine checking of intrauterine contraceptive device (IUD) Mirena IUD insertion 12/23/2020 for Menorrhagia.  Since then, had BTB most days.  Difficult to say when her menstrual period was, but probably last week and now bleeding much less.  No pelvic pain.  No abnormal vaginal d/c.  No fever.  Gyn exam normal.  IUD strings in good position, no evidence of infection.  Patient declines the Progestin Pill to control BTB, prefers observation at this time. - CBC; Future  2. Health care maintenance - CBC; Future - Comp Met (CMET); Future - TSH; Future - Lipid Profile; Future - Vitamin D 1,25 dihydroxy; Future   Princess Bruins MD, 10:30 AM 02/01/2021

## 2021-02-02 ENCOUNTER — Telehealth: Payer: Self-pay

## 2021-02-02 DIAGNOSIS — Z1211 Encounter for screening for malignant neoplasm of colon: Secondary | ICD-10-CM

## 2021-02-02 NOTE — Telephone Encounter (Signed)
Pt called inquiring about referral to Dr. Collene Mares. Per Jw's note she had faxed referral on 01/29/21. However pt reports Dr. Lorie Apley office said they had not received the fax. New fax in process of sending. Awaiting confirmation.

## 2021-02-03 NOTE — Telephone Encounter (Signed)
Patient called to check on referral. I let her know that we have faxed it no less than 40 times. We have two fax #'s for that office and I confirmed them both with their office. The referral fax will not go through to them. I told her as soon as it is faxed I will let her know. I told her if it does not go through by tomorrow we can just mail it to that office and I will let her know if we end up doing that as well.

## 2021-02-04 NOTE — Telephone Encounter (Signed)
Called patient and left message that Gina Valentine tried early this morning to fax the referral and still it did not go through. I have typed a letter (in chart) and attached to referral info and am mailing it to their office. I asked them to let me know appt date/time and asked patient the same.

## 2021-02-05 ENCOUNTER — Other Ambulatory Visit: Payer: Self-pay

## 2021-02-05 ENCOUNTER — Other Ambulatory Visit: Payer: 59

## 2021-02-05 ENCOUNTER — Other Ambulatory Visit: Payer: Self-pay | Admitting: *Deleted

## 2021-02-05 DIAGNOSIS — Z0000 Encounter for general adult medical examination without abnormal findings: Secondary | ICD-10-CM

## 2021-02-05 DIAGNOSIS — Z30431 Encounter for routine checking of intrauterine contraceptive device: Secondary | ICD-10-CM

## 2021-02-06 LAB — VITAMIN D 25 HYDROXY (VIT D DEFICIENCY, FRACTURES): Vit D, 25-Hydroxy: 69 ng/mL (ref 30–100)

## 2021-02-09 ENCOUNTER — Encounter: Payer: Self-pay | Admitting: Physician Assistant

## 2021-02-09 ENCOUNTER — Other Ambulatory Visit: Payer: Self-pay

## 2021-02-09 ENCOUNTER — Ambulatory Visit: Payer: 59 | Admitting: Physician Assistant

## 2021-02-09 DIAGNOSIS — Z86018 Personal history of other benign neoplasm: Secondary | ICD-10-CM | POA: Diagnosis not present

## 2021-02-09 DIAGNOSIS — Z808 Family history of malignant neoplasm of other organs or systems: Secondary | ICD-10-CM | POA: Diagnosis not present

## 2021-02-09 DIAGNOSIS — Z1283 Encounter for screening for malignant neoplasm of skin: Secondary | ICD-10-CM

## 2021-02-09 NOTE — Progress Notes (Signed)
° °  Follow-Up Visit   Subjective  Gina Valentine is a 50 y.o. female who presents for the following: Annual Exam (No new concern. Personal history of atypical moles. Family history of non mole skin cancers. ).   The following portions of the chart were reviewed this encounter and updated as appropriate:  Tobacco   Allergies   Meds   Problems   Med Hx   Surg Hx   Fam Hx       Objective  Well appearing patient in no apparent distress; mood and affect are within normal limits.  A full examination was performed including scalp, head, eyes, ears, nose, lips, neck, chest, axillae, abdomen, back, buttocks, bilateral upper extremities, bilateral lower extremities, hands, feet, fingers, toes, fingernails, and toenails. All findings within normal limits unless otherwise noted below.  head to toe No atypical nevi or signs of NMSC noted at the time of the visit.    Assessment & Plan  Screening exam for skin cancer head to toe  Yearly skin examinations    I, Jamey Demchak, PA-C, have reviewed all documentation's for this visit.  The documentation on 02/09/21 for the exam, diagnosis, procedures and orders are all accurate and complete.

## 2021-02-10 LAB — COMPREHENSIVE METABOLIC PANEL
AG Ratio: 1.5 (calc) (ref 1.0–2.5)
ALT: 10 U/L (ref 6–29)
AST: 15 U/L (ref 10–35)
Albumin: 4.3 g/dL (ref 3.6–5.1)
Alkaline phosphatase (APISO): 37 U/L (ref 31–125)
BUN: 12 mg/dL (ref 7–25)
CO2: 29 mmol/L (ref 20–32)
Calcium: 9.4 mg/dL (ref 8.6–10.2)
Chloride: 102 mmol/L (ref 98–110)
Creat: 0.7 mg/dL (ref 0.50–0.99)
Globulin: 2.8 g/dL (calc) (ref 1.9–3.7)
Glucose, Bld: 82 mg/dL (ref 65–99)
Potassium: 3.9 mmol/L (ref 3.5–5.3)
Sodium: 138 mmol/L (ref 135–146)
Total Bilirubin: 0.5 mg/dL (ref 0.2–1.2)
Total Protein: 7.1 g/dL (ref 6.1–8.1)

## 2021-02-10 LAB — LIPID PANEL
Cholesterol: 182 mg/dL (ref <200)
HDL: 72 mg/dL (ref >=50)
LDL Cholesterol (Calc): 94 mg/dL (calc)
Non-HDL Cholesterol (Calc): 110 mg/dL (calc) (ref <130)
Total CHOL/HDL Ratio: 2.5 (calc) (ref <5.0)
Triglycerides: 73 mg/dL (ref <150)

## 2021-02-10 LAB — CBC
HCT: 35 % (ref 35.0–45.0)
Hemoglobin: 11.5 g/dL — ABNORMAL LOW (ref 11.7–15.5)
MCH: 27.9 pg (ref 27.0–33.0)
MCHC: 32.9 g/dL (ref 32.0–36.0)
MCV: 85 fL (ref 80.0–100.0)
MPV: 9.7 fL (ref 7.5–12.5)
Platelets: 301 10*3/uL (ref 140–400)
RBC: 4.12 10*6/uL (ref 3.80–5.10)
RDW: 13.8 % (ref 11.0–15.0)
WBC: 4.2 10*3/uL (ref 3.8–10.8)

## 2021-02-10 LAB — VITAMIN D 1,25 DIHYDROXY
Vitamin D 1, 25 (OH)2 Total: 45 pg/mL (ref 18–72)
Vitamin D2 1, 25 (OH)2: <8 pg/mL
Vitamin D3 1, 25 (OH)2: 45 pg/mL

## 2021-02-10 LAB — TSH: TSH: 2.98 mIU/L

## 2021-03-04 NOTE — Telephone Encounter (Signed)
Called pt to f/u and see if she had heard anything from their office. Pt reported she had not and she was ready to see if she could just go somewhere else where she could get contacted quicker. Pt advised that since it was late in the day, I will try to contact Dr. Youlanda Mighty office tomorrow myself and see what is going on and if they had received referral and if no luck then we will see about getting her referred to another office. Pt voiced understanding.

## 2021-03-05 NOTE — Telephone Encounter (Signed)
Rep of Dr. Lorie Apley office reported that referral could still be sitting on Dr. Lorie Apley desk for review. However, she stated Dr. Collene Mares was going to be out for the next couple/few weeks--did not specify when she would return. Pt informed and wishes to go to another facility. Will send referral to [facility] and gave pt their number to call later today or next busniess day. Pt voiced understanding.

## 2021-03-18 ENCOUNTER — Encounter: Payer: Self-pay | Admitting: Gastroenterology

## 2021-03-31 ENCOUNTER — Other Ambulatory Visit: Payer: Self-pay | Admitting: General Surgery

## 2021-03-31 DIAGNOSIS — Z1239 Encounter for other screening for malignant neoplasm of breast: Secondary | ICD-10-CM

## 2021-04-06 NOTE — Telephone Encounter (Signed)
Pt scheduled for 04/23/21.

## 2021-04-15 ENCOUNTER — Other Ambulatory Visit: Payer: Self-pay

## 2021-04-15 ENCOUNTER — Ambulatory Visit
Admission: RE | Admit: 2021-04-15 | Discharge: 2021-04-15 | Disposition: A | Discharge status: Home / Self Care | Payer: 59 | Source: Ambulatory Visit | Attending: General Surgery | Admitting: General Surgery

## 2021-04-15 DIAGNOSIS — Z1239 Encounter for other screening for malignant neoplasm of breast: Secondary | ICD-10-CM

## 2021-04-15 IMAGING — MR MR BREAST BILAT WO/W CM
8 of 12 series · 32 of 48 positions shown · IV contrast (7 ml gadavist)
Comparison: Previous exam(s). Previous breast MRI dated [DATE].
COMPARISON: Previous exam(s). Previous breast MRI dated [DATE].

Addendum:
CLINICAL DATA: Mammographically dense breast. Supplemental
screening.

EXAM:
BILATERAL BREAST MRI WITH AND WITHOUT CONTRAST
TECHNIQUE: Multiplanar, multisequence MR images of both breasts were obtained
prior to and following the intravenous administration of 7 ml of
Gadavist

[Series 2: t2_tirm_tra ipat (a-p) · axial · 3.0mm · 0.70mm/px · 1 of 55 slices shown]
[im 1/55]
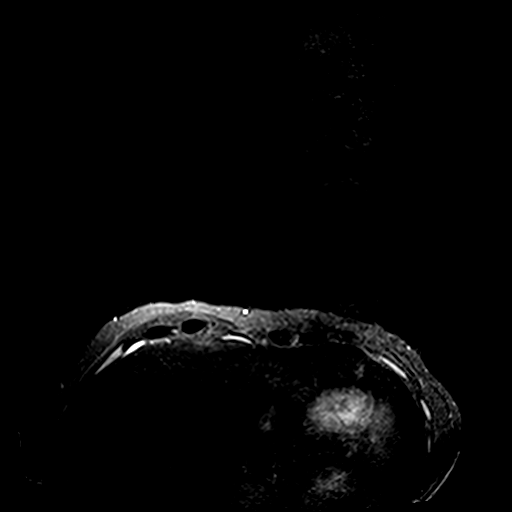

[Series 3: fl3d pre-cm no · axial · non-contrast · 1.2mm · 0.89mm/px · z∈[-116,+55]mm · 5 of 144 slices shown]
[im 1/144]
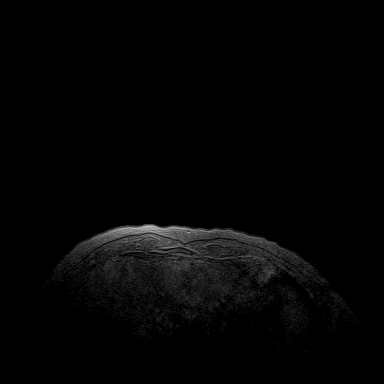
[im 36/144]
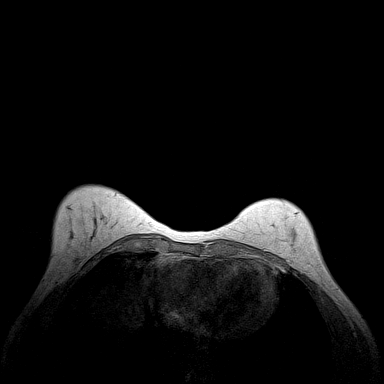
[im 72/144]
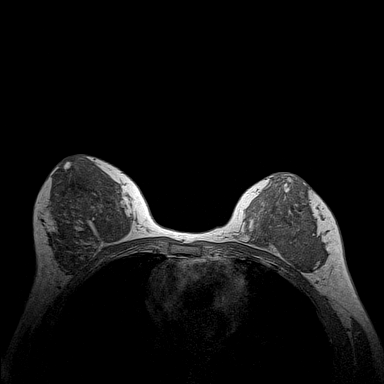
[im 108/144]
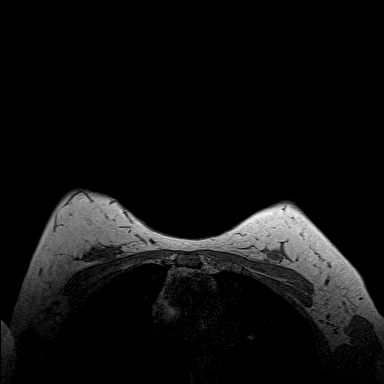
[im 144/144]
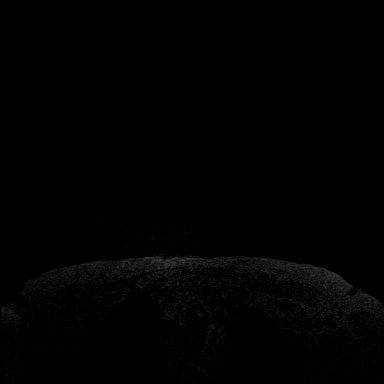

[Series 4: fl3d pre-cm · axial · non-contrast · 1.2mm · 0.89mm/px · z∈[-116,+55]mm · 5 of 144 slices shown]
[im 1/144]
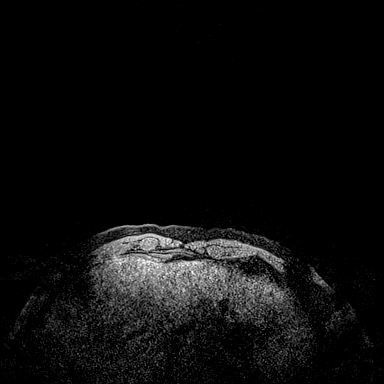
[im 36/144]
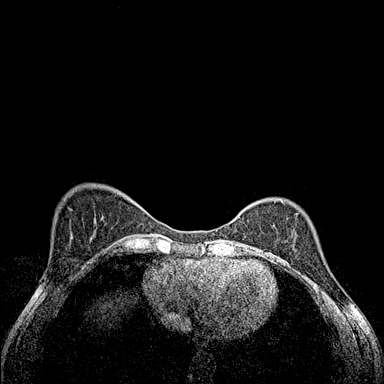
[im 72/144]
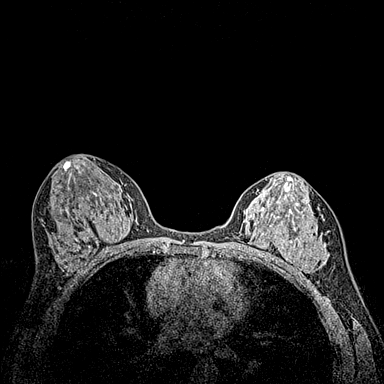
[im 108/144]
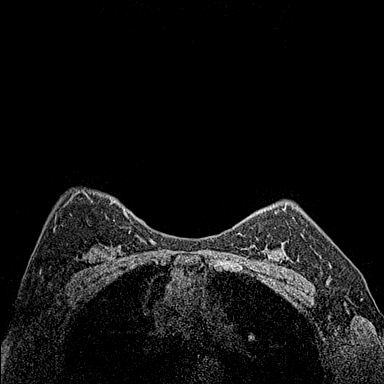
[im 144/144]
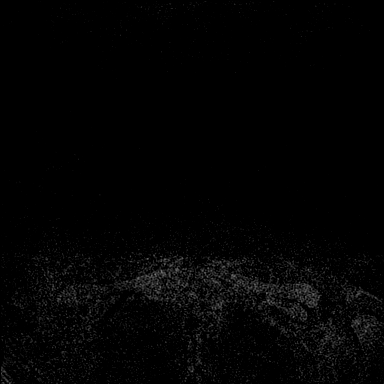

[Series 5: fl3d post-cm 20 · axial · 1.2mm · 0.89mm/px · z∈[-116,+55]mm · 5 of 144 slices shown (1 of 3)]
[im 1/144]
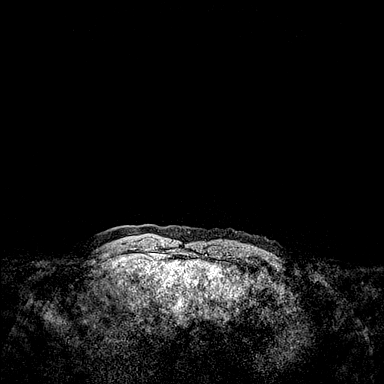
[im 36/144]
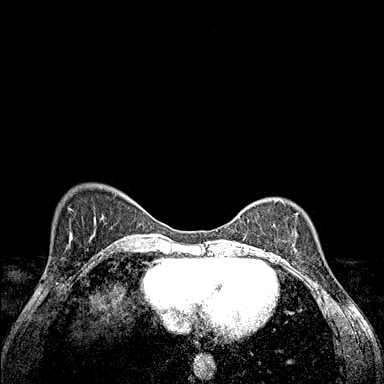
[im 72/144]
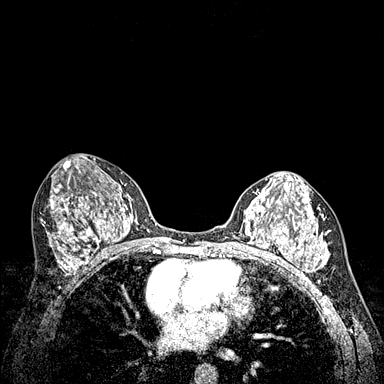
[im 108/144]
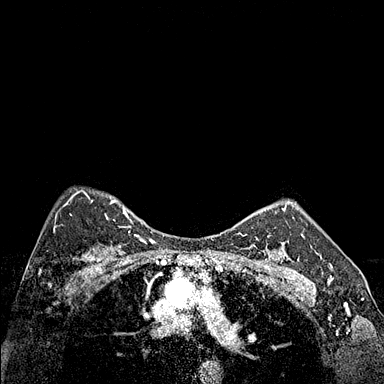
[im 144/144]
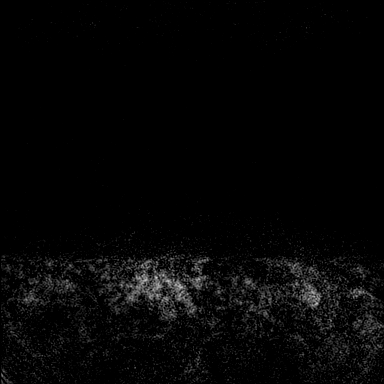

[Series 6: fl3d post-cm 20 · axial · 1.2mm · 0.89mm/px · z∈[-116,+55]mm · 5 of 144 slices shown (2 of 3)]
[im 1/144]
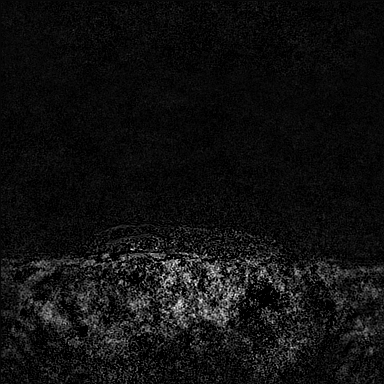
[im 36/144]
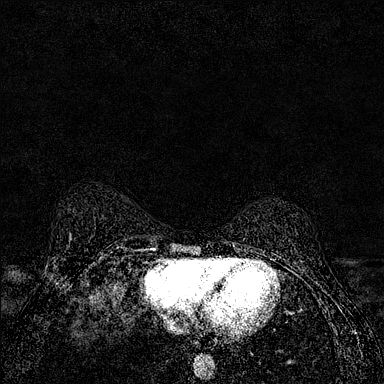
[im 72/144]
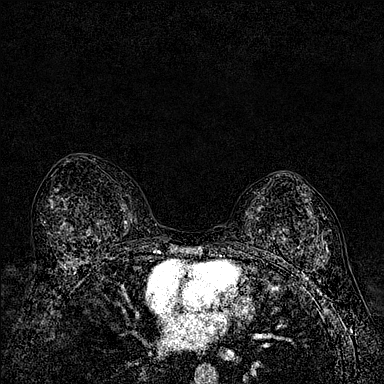
[im 108/144]
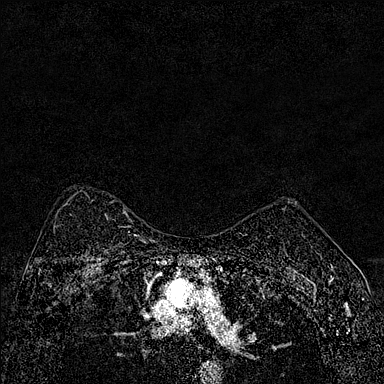
[im 144/144]
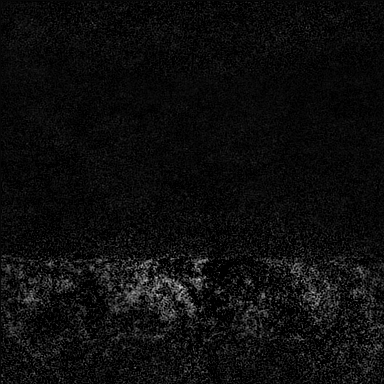

[Series 7: fl3d post-cm 20 · axial · 172.8mm · 0.89mm/px · 1 of 1 slices shown (3 of 3)]
[im 1/1]
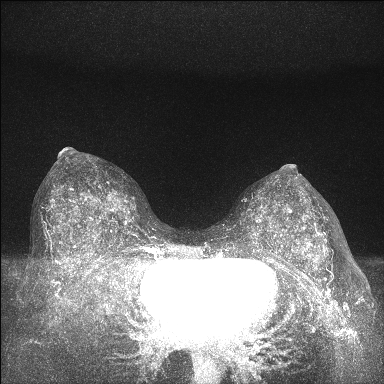

[Series 8: fl3d post-cm 3 · axial · 1.2mm · 0.89mm/px · z∈[-116,+55]mm · 6 of 144 slices shown (1 of 2)]
[im 1/144]
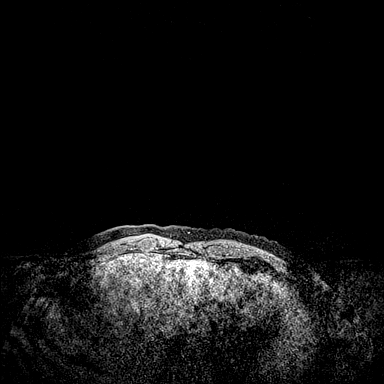
[im 29/144]
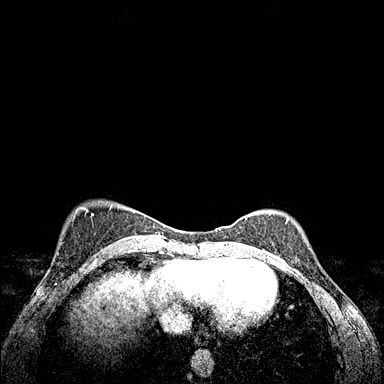
[im 58/144]
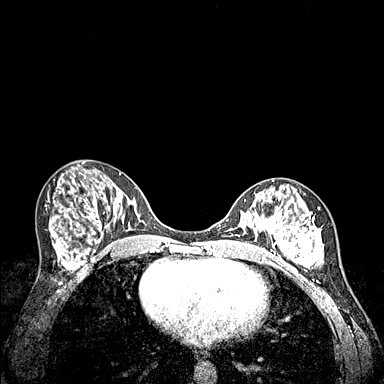
[im 86/144]
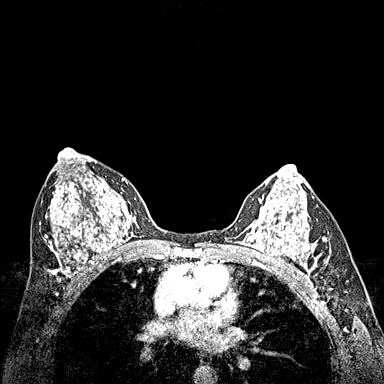
[im 115/144]
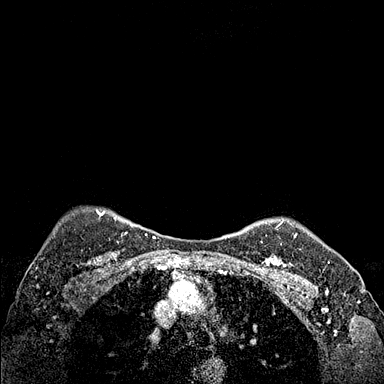
[im 144/144]
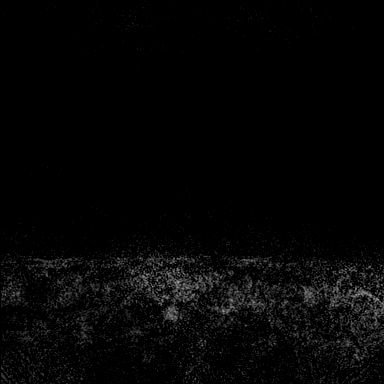

[Series 9: fl3d post-cm 3 · axial · 1.2mm · 0.89mm/px · z∈[-116,-14]mm · 4 of 144 slices shown (2 of 2)]
[im 1/144]
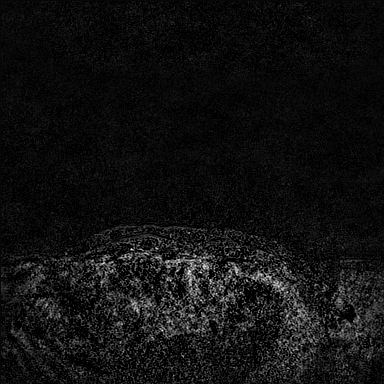
[im 29/144]
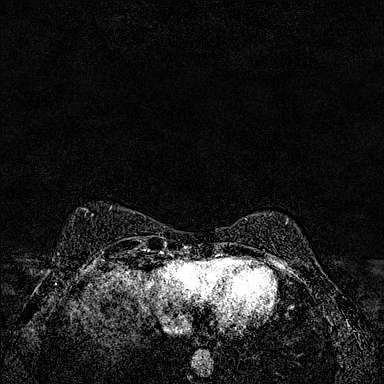
[im 58/144]
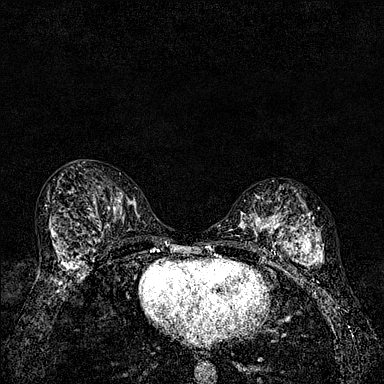
[im 86/144]
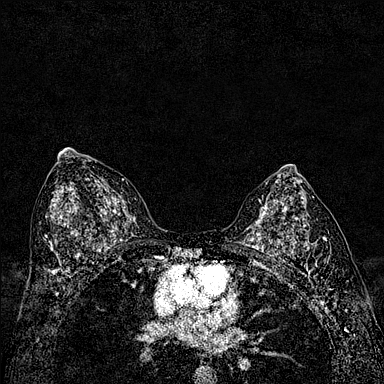

[32 of 48 positions shown; findings below may reference images not displayed]

Three-dimensional MR images were rendered by post-processing of the
original MR data on an independent workstation. The
three-dimensional MR images were interpreted, and findings are
reported in the following complete MRI report for this study. Three
dimensional images were evaluated at the independent interpreting
workstation using the DynaCAD thin client.
FINDINGS: Breast composition: d. Extreme fibroglandular tissue.

Background parenchymal enhancement: Marked

Right breast: No mass or abnormal enhancement.  Several small cysts.

Left breast: No mass or abnormal enhancement.  Several small cysts.

Lymph nodes: No abnormal appearing lymph nodes.

Ancillary findings:  None.
IMPRESSION: 1. No evidence of breast malignancy.
2. Benign bilateral cysts.

RECOMMENDATION:
Annual screening mammography. Patient is also are small or annual
previous breast MRI if she wishes.

BI-RADS CATEGORY  1: Negative.

ADDENDUM:
Correction to the recommendation section. In addition to annual
screening mammography, patient is eligible for supplemental annual
abbreviated breast MRI.

*** End of Addendum ***
Three-dimensional MR images were rendered by post-processing of the
original MR data on an independent workstation. The
three-dimensional MR images were interpreted, and findings are
reported in the following complete MRI report for this study. Three
dimensional images were evaluated at the independent interpreting
workstation using the DynaCAD thin client.
FINDINGS: Breast composition: d. Extreme fibroglandular tissue.

Background parenchymal enhancement: Marked

Right breast: No mass or abnormal enhancement.  Several small cysts.

Left breast: No mass or abnormal enhancement.  Several small cysts.

Lymph nodes: No abnormal appearing lymph nodes.

Ancillary findings:  None.
IMPRESSION: 1. No evidence of breast malignancy.
2. Benign bilateral cysts.

RECOMMENDATION:
Annual screening mammography. Patient is also are small or annual
previous breast MRI if she wishes.

BI-RADS CATEGORY  1: Negative.

## 2021-04-15 MED ORDER — GADOBUTROL 1 MMOL/ML IV SOLN
7.0000 mL | Freq: Once | INTRAVENOUS | Status: AC | PRN
  Administered 2021-04-15: 7 mL via INTRAVENOUS

## 2021-04-23 ENCOUNTER — Ambulatory Visit (AMBULATORY_SURGERY_CENTER): Payer: 59 | Admitting: *Deleted

## 2021-04-23 ENCOUNTER — Other Ambulatory Visit: Payer: Self-pay

## 2021-04-23 VITALS — Ht 67.0 in | Wt 153.0 lb

## 2021-04-23 DIAGNOSIS — Z1211 Encounter for screening for malignant neoplasm of colon: Secondary | ICD-10-CM

## 2021-04-23 MED ORDER — NA SULFATE-K SULFATE-MG SULF 17.5-3.13-1.6 GM/177ML PO SOLN: 1.0000 | Freq: Once | ORAL | 0 refills | Status: AC

## 2021-04-23 NOTE — Progress Notes (Signed)
No egg or soy allergy known to patient  No issues known to pt with past sedation with any surgeries or procedures Patient denies ever being told they had issues or difficulty with intubation  No FH of Malignant Hyperthermia Pt is not on diet pills Pt is not on  home 02  Pt is not on blood thinners  Pt denies issues with constipation  No A fib or A flutter  Pt is  vaccinated  for Covid   Due to the COVID-19 pandemic we are asking patients to follow certain guidelines in PV and the [place]   Pt aware of COVID protocols and [facility] guidelines   PV completed over the phone. Pt verified name, DOB, address and insurance during PV today.  Pt mailed instruction packet with copy of consent form to read and not return, and instructions.  Pt encouraged to call with questions or issues.  If pt has My chart, procedure instructions sent via My Chart    Colonoscopy classification sheet mailed with packet.

## 2021-04-30 ENCOUNTER — Encounter: Payer: Self-pay | Admitting: Gastroenterology

## 2021-05-07 ENCOUNTER — Ambulatory Visit (AMBULATORY_SURGERY_CENTER): Payer: 59 | Admitting: Gastroenterology

## 2021-05-07 ENCOUNTER — Encounter: Payer: Self-pay | Admitting: Gastroenterology

## 2021-05-07 ENCOUNTER — Other Ambulatory Visit: Payer: Self-pay

## 2021-05-07 VITALS — BP 103/67 | HR 63 | Temp 98.6°F | Resp 18 | Ht 67.0 in | Wt 153.0 lb

## 2021-05-07 DIAGNOSIS — Z1211 Encounter for screening for malignant neoplasm of colon: Secondary | ICD-10-CM | POA: Diagnosis not present

## 2021-05-07 DIAGNOSIS — K635 Polyp of colon: Secondary | ICD-10-CM

## 2021-05-07 DIAGNOSIS — D128 Benign neoplasm of rectum: Secondary | ICD-10-CM

## 2021-05-07 DIAGNOSIS — D120 Benign neoplasm of cecum: Secondary | ICD-10-CM

## 2021-05-07 MED ORDER — SODIUM CHLORIDE 0.9 % IV SOLN: 500.0000 mL | Freq: Once | INTRAVENOUS | Status: DC

## 2021-05-07 NOTE — Patient Instructions (Signed)
YOU HAD AN ENDOSCOPIC PROCEDURE TODAY AT THE [facility]:   Refer to the procedure report that was given to you for any specific questions about what was found during the examination.  If the procedure report does not answer your questions, please call your gastroenterologist to clarify.  If you requested that your care partner not be given the details of your procedure findings, then the procedure report has been included in a sealed envelope for you to review at your convenience later.  YOU SHOULD EXPECT: Some feelings of bloating in the abdomen. Passage of more gas than usual.  Walking can help get rid of the air that was put into your GI tract during the procedure and reduce the bloating. If you had a lower endoscopy (such as a colonoscopy or flexible sigmoidoscopy) you may notice spotting of blood in your stool or on the toilet paper. If you underwent a bowel prep for your procedure, you may not have a normal bowel movement for a few days.  Please Note:  You might notice some irritation and congestion in your nose or some drainage.  This is from the oxygen used during your procedure.  There is no need for concern and it should clear up in a day or so.  SYMPTOMS TO REPORT IMMEDIATELY:   Following lower endoscopy (colonoscopy or flexible sigmoidoscopy):  Excessive amounts of blood in the stool  Significant tenderness or worsening of abdominal pains  Swelling of the abdomen that is new, acute  Fever of 100F or higher  For urgent or emergent issues, a gastroenterologist can be reached at any hour by calling [phone]. Do not use MyChart messaging for urgent concerns.    DIET:  We do recommend a small meal at first, but then you may proceed to your regular diet.  Drink plenty of fluids but you should avoid alcoholic beverages for 24 hours.  ACTIVITY:  You should plan to take it easy for the rest of today and you should NOT DRIVE or use heavy machinery until tomorrow (because  of the sedation medicines used during the test).    FOLLOW UP: Our staff will call the number listed on your records 48-72 hours following your procedure to check on you and address any questions or concerns that you may have regarding the information given to you following your procedure. If we do not reach you, we will leave a message.  We will attempt to reach you two times.  During this call, we will ask if you have developed any symptoms of COVID 19. If you develop any symptoms (ie: fever, flu-like symptoms, shortness of breath, cough etc.) before then, please call [phone].  If you test positive for Covid 19 in the 2 weeks post procedure, please call and report this information to us.    If any biopsies were taken you will be contacted by phone or by letter within the next 1-3 weeks.  Please call us at [phone] if you have not heard about the biopsies in 3 weeks.    SIGNATURES/CONFIDENTIALITY: You and/or your care partner have signed paperwork which will be entered into your electronic medical record.  These signatures attest to the fact that that the information above on your After Visit Summary has been reviewed and is understood.  Full responsibility of the confidentiality of this discharge information lies with you and/or your care-partner. 

## 2021-05-07 NOTE — Progress Notes (Signed)
Pt's states no medical or surgical changes since previsit or office visit. 

## 2021-05-07 NOTE — Op Note (Signed)
[facility] ?Patient Name: Gina Valentine ?Procedure Date: 05/07/2021 8:04 AM ?MRN: [id number] ?Endoscopist: Mauri Pole , MD ?Age: 51 ?Referring MD:  ?Date of Birth: Aug 27, 1970 ?Gender: Female ?Account #: [id number] ?Procedure:                Colonoscopy ?Indications:              Screening for colorectal malignant neoplasm ?Medicines:                Monitored Anesthesia Care ?Procedure:                Pre-Anesthesia Assessment: ?                          - Prior to the procedure, a History and Physical  ?                          was performed, and patient medications and  ?                          allergies were reviewed. The patient's tolerance of  ?                          previous anesthesia was also reviewed. The risks  ?                          and benefits of the procedure and the sedation  ?                          options and risks were discussed with the patient.  ?                          All questions were answered, and informed consent  ?                          was obtained. Prior Anticoagulants: The patient has  ?                          taken no previous anticoagulant or antiplatelet  ?                          agents. ASA Grade Assessment: II - A patient with  ?                          mild systemic disease. After reviewing the risks  ?                          and benefits, the patient was deemed in  ?                          satisfactory condition to undergo the procedure. ?                          After obtaining informed consent, the colonoscope  ?  was passed under direct vision. Throughout the  ?                          procedure, the patient's blood pressure, pulse, and  ?                          oxygen saturations were monitored continuously. The  ?                          Olympus PCF-H190DL ([id number]) Colonoscope was  ?                          introduced through the anus and advanced to the the  ?                          cecum,  identified by appendiceal orifice and  ?                          ileocecal valve. The colonoscopy was performed  ?                          without difficulty. The patient tolerated the  ?                          procedure well. The quality of the bowel  ?                          preparation was good. The ileocecal valve,  ?                          appendiceal orifice, and rectum were photographed. ?Scope In: 8:14:00 AM ?Scope Out: 8:35:04 AM ?Scope Withdrawal Time: 0 hours 13 minutes 16 seconds  ?Total Procedure Duration: 0 hours 21 minutes 4 seconds  ?Findings:                 The perianal and digital rectal examinations were  ?                          normal. ?                          A less than 1 mm polyp was found in the cecum. The  ?                          polyp was sessile. The polyp was removed with a  ?                          cold biopsy forceps. Resection and retrieval were  ?                          complete. ?                          A 7 mm polyp was found in the rectum. The polyp was  ?  sessile. The polyp was removed with a cold snare.  ?                          Resection and retrieval were complete. ?                          Non-bleeding external and internal hemorrhoids were  ?                          found during retroflexion. The hemorrhoids were  ?                          medium-sized. ?Complications:            No immediate complications. ?Estimated Blood Loss:     Estimated blood loss was minimal. ?Impression:               - One less than 1 mm polyp in the cecum, removed  ?                          with a cold biopsy forceps. Resected and retrieved. ?                          - One 7 mm polyp in the rectum, removed with a cold  ?                          snare. Resected and retrieved. ?                          - Non-bleeding external and internal hemorrhoids. ?Recommendation:           - Patient has a contact number available for  ?                           emergencies. The signs and symptoms of potential  ?                          delayed complications were discussed with the  ?                          patient. Return to normal activities tomorrow.  ?                          Written discharge instructions were provided to the  ?                          patient. ?                          - Resume previous diet. ?                          - Continue present medications. ?                          - Await pathology results. ?                          -  Repeat colonoscopy in 5-10 years for surveillance  ?                          based on pathology results. ?Mauri Pole, MD ?05/07/2021 8:42:58 AM ?This report has been signed electronically. ?

## 2021-05-07 NOTE — Progress Notes (Signed)
[facility] History and Physical ? ? ?Primary Care Physician:  Vernie Shanks, MD ? ? ?Reason for Procedure:  Colorectal cancer screening ? ?Plan:    Screening colonoscopy with possible interventions as needed ? ? ? ? ?HPI: Gina Valentine is a very pleasant 51 y.o. female here for screening colonoscopy. ?Denies any nausea, vomiting, abdominal pain, melena or bright red blood per rectum ? ?The risks and benefits as well as alternatives of endoscopic procedure(s) have been discussed and reviewed. All questions answered. The patient agrees to proceed. ? ? ? ?Past Medical History:  ?Diagnosis Date  ? Allergy   ? seasonal  ? Anxiety   ? Arthritis   ? Cesarean delivery delivered   ? CIN I (cervical intraepithelial neoplasia I)   ? Heart murmur   ? Melasma   ? Menorrhagia   ? Migraine   ? ? ?Past Surgical History:  ?Procedure Laterality Date  ? BREAST SURGERY    ? R BREAST FIBROADEMOMA  ? CESAREAN SECTION    ? 2007  ? DILATION AND EVACUATION    ? 1ST TRIMESTER MISSED AB  ? FOOT SURGERY Left   ? CYST REMOVED  ? TONSILLECTOMY AND ADENOIDECTOMY    ? ? ?Prior to Admission medications   ?Medication Sig Start Date End Date Taking? Authorizing Provider  ?Calcium Citrate-Vitamin D (CALCIUM + D PO) Take by mouth daily.   Yes [provider]  ?fluticasone (FLONASE) 50 MCG/ACT nasal spray instill 1 spray into each nostril once daily 09/23/16  Yes Terrance Mass, MD  ?hydrOXYzine (ATARAX/VISTARIL) 25 MG tablet SMARTSIG:0.5-1 Tablet(s) By Mouth 1-3 Times Daily PRN 10/26/20  Yes [provider]  ?Magnesium 125 MG CAPS daily. 04/01/19  Yes [provider]  ?methocarbamol (ROBAXIN) 750 MG tablet Take 750 mg by mouth 4 (four) times daily as needed. 09/01/20  Yes [provider]  ?Misc Natural Products (OSTEO BI-FLEX/5-LOXIN ADVANCED) TABS daily. 04/01/19  Yes [provider]  ?Multiple Vitamin (MULTI-VITAMIN PO) Take by mouth daily.   Yes [provider]  ?NURTEC 75 MG TBDP  Take by mouth every other day. 11/19/20  Yes [provider]  ?Omega-3 Fatty Acids (FISH OIL PO) Take by mouth daily.   Yes [provider]  ?triamterene-hydrochlorothiazide (DYAZIDE) 37.5-25 MG capsule Take 1 capsule by mouth daily. 11/25/20  Yes [provider]  ?UNABLE TO FIND zyrtec   Yes [provider]  ?promethazine (PHENERGAN) 25 MG tablet Take 25 mg by mouth every 6 (six) hours as needed. ?Patient not taking: Reported on 05/07/2021 09/01/20   [provider]  ?SUMAtriptan (IMITREX) 100 MG tablet SMARTSIG:1 Tablet(s) By Mouth Every 2 Hours PRN ?Patient not taking: Reported on 05/07/2021 10/26/20   [provider]  ? ? ?Current Outpatient Medications  ?Medication Sig Dispense Refill  ? Calcium Citrate-Vitamin D (CALCIUM + D PO) Take by mouth daily.    ? fluticasone (FLONASE) 50 MCG/ACT nasal spray instill 1 spray into each nostril once daily 16 g 2  ? hydrOXYzine (ATARAX/VISTARIL) 25 MG tablet SMARTSIG:0.5-1 Tablet(s) By Mouth 1-3 Times Daily PRN    ? Magnesium 125 MG CAPS daily.    ? methocarbamol (ROBAXIN) 750 MG tablet Take 750 mg by mouth 4 (four) times daily as needed.    ? Misc Natural Products (OSTEO BI-FLEX/5-LOXIN ADVANCED) TABS daily.    ? Multiple Vitamin (MULTI-VITAMIN PO) Take by mouth daily.    ? NURTEC 75 MG TBDP Take by mouth every other day.    ?  Omega-3 Fatty Acids (FISH OIL PO) Take by mouth daily.    ? triamterene-hydrochlorothiazide (DYAZIDE) 37.5-25 MG capsule Take 1 capsule by mouth daily.    ? UNABLE TO FIND zyrtec    ? promethazine (PHENERGAN) 25 MG tablet Take 25 mg by mouth every 6 (six) hours as needed. (Patient not taking: Reported on 05/07/2021)    ? SUMAtriptan (IMITREX) 100 MG tablet SMARTSIG:1 Tablet(s) By Mouth Every 2 Hours PRN (Patient not taking: Reported on 05/07/2021)    ? ?Current Facility-Administered Medications  ?Medication Dose Route Frequency Provider Last Rate Last Admin  ? 0.9 %  sodium chloride infusion  500 mL  Intravenous Once Madilynn Montante, Venia Minks, MD      ? ? ?Allergies as of 05/07/2021 - Review Complete 05/07/2021  ?Allergen Reaction Noted  ? Azithromycin Other (See Comments) 09/09/2020  ? ? ?Family History  ?Problem Relation Age of Onset  ? Diabetes Mother   ? Hypertension Mother   ? Stomach cancer Father   ? Colon polyps Father   ? Heart disease Maternal Grandmother   ? Colon cancer Neg Hx   ? Esophageal cancer Neg Hx   ? Rectal cancer Neg Hx   ? ? ?Social History  ? ?Socioeconomic History  ? Marital status: Married  ?  Spouse name: Not on file  ? Number of children: Not on file  ? Years of education: Not on file  ? Highest education level: Not on file  ?Occupational History  ? Not on file  ?Tobacco Use  ? Smoking status: Never  ?  Passive exposure: Never  ? Smokeless tobacco: Never  ?Vaping Use  ? Vaping Use: Never used  ?Substance and Sexual Activity  ? Alcohol use: Yes  ?  Alcohol/week: 0.0 standard drinks  ?  Comment: occasional, socially  ? Drug use: No  ? Sexual activity: Yes  ?  Partners: Male  ?  Birth control/protection: Other-see comments  ?  Comment: husband vasectomy, 1st intercourse- 17, partners- 58, married- 6 yrs  ?Other Topics Concern  ? Not on file  ?Social History Narrative  ? Not on file  ? ?Social Determinants of Health  ? ?Financial Resource Strain: Not on file  ?Food Insecurity: Not on file  ?Transportation Needs: Not on file  ?Physical Activity: Not on file  ?Stress: Not on file  ?Social Connections: Not on file  ?Intimate Partner Violence: Not on file  ? ? ?Review of Systems: ? ?All other review of systems negative except as mentioned in the HPI. ? ?Physical Exam: ?Vital signs in last 24 hours: ?BP 114/82   Pulse 67   Temp 98.6 ?F (37 ?C) (Skin)   Resp 13   Ht '5\' 7"'$  (1.702 m)   Wt 153 lb (69.4 kg)   LMP 04/23/2021 (Exact Date)   SpO2 100%   BMI 23.96 kg/m?  ?General:   Alert, NAD ?Lungs:  Clear .   ?Heart:  Regular rate and rhythm ?Abdomen:  Soft, nontender and  nondistended. ?Neuro/Psych:  Alert and cooperative. Normal mood and affect. A and O x 3 ? ?Reviewed labs, radiology imaging, old records and pertinent past GI work up ? ?Patient is appropriate for planned procedure(s) and anesthesia in an ambulatory setting ? ? ?K. Denzil Magnuson , MD ?[phone]  ? ? ?  ?

## 2021-05-07 NOTE — Progress Notes (Signed)
PT taken to PACU. Monitors in place. VSS. Report given to RN. 

## 2021-05-11 ENCOUNTER — Telehealth: Payer: Self-pay

## 2021-05-11 NOTE — Telephone Encounter (Signed)
?  Follow up Call- ? ?Call back number 05/07/2021  ?Post procedure Call Back phone  # [phone]  ?Permission to leave phone message Yes  ?Some recent data might be hidden  ?  ? ?Patient questions: ? ?Do you have a fever, pain , or abdominal swelling? No. ?Pain Score  0 * ? ?Have you tolerated food without any problems? Yes.   ? ?Have you been able to return to your normal activities? Yes.   ? ?Do you have any questions about your discharge instructions: ?Diet   No. ?Medications  No. ?Follow up visit  No. ? ?Do you have questions or concerns about your Care? No. ? ?Actions: ?* If pain score is 4 or above: ?No action needed, pain <4. ? ? ? ?

## 2021-05-13 ENCOUNTER — Encounter: Payer: Self-pay | Admitting: Gastroenterology

## 2021-09-15 ENCOUNTER — Encounter: Payer: Self-pay | Admitting: Obstetrics & Gynecology

## 2021-11-30 ENCOUNTER — Ambulatory Visit: Payer: 59 | Admitting: Obstetrics & Gynecology

## 2021-12-07 ENCOUNTER — Telehealth: Payer: Self-pay | Admitting: *Deleted

## 2021-12-07 NOTE — Telephone Encounter (Signed)
Patient called asking if you are willing to prescribe possible anti depressant? Her PCP has retired and she has established care with another provider yet. Reports her father passed away this year and taking care of elder mother as well. She is aware a visit is needed, annual exam scheduled 01/25/22 would like to come sooner to discuss possible medication. Patient wanted to make sure this is something you would feel comfortable possibly prescribing. Please advise

## 2021-12-09 NOTE — Telephone Encounter (Signed)
Message sent to appointments to schedule. 

## 2021-12-13 ENCOUNTER — Ambulatory Visit (INDEPENDENT_AMBULATORY_CARE_PROVIDER_SITE_OTHER): Payer: BC Managed Care – PPO | Admitting: Nurse Practitioner

## 2021-12-13 ENCOUNTER — Encounter: Payer: Self-pay | Admitting: Nurse Practitioner

## 2021-12-13 VITALS — BP 108/82 | HR 68

## 2021-12-13 DIAGNOSIS — F419 Anxiety disorder, unspecified: Secondary | ICD-10-CM

## 2021-12-13 MED ORDER — SERTRALINE HCL 50 MG PO TABS
50.0000 mg | ORAL_TABLET | Freq: Every day | ORAL | 0 refills | Status: DC
Start: 2021-12-13 — End: 2022-01-25

## 2021-12-13 NOTE — Progress Notes (Signed)
   Acute Office Visit  Subjective:    Patient ID: Gina Valentine, female    DOB: 08/15/1970, 51 y.o.   MRN: [id number]   HPI 51 y.o. presents today to discuss medication management for anxiety. She has a lot of personal stress with father passing this year, caring for mother, new job, and teenage daughter. She has been feeling very overwhelmed, jittery, having elevated heart rates at rest, and neck rashes. Has had anxiety in the past, did well on Prozac but stopped taking due to weight gain. Was on Effexor for migraine management but was on high dose and had a hard time weaning off. Hydroxyzine TID as needed for headaches but does not feel it is helping with her anxiety. She sees therapist, has good self-care habits, sleeping well.     Review of Systems  Constitutional: Negative.   Psychiatric/Behavioral:  The patient is nervous/anxious.        Objective:    Physical Exam Constitutional:      Appearance: Normal appearance.  Psychiatric:        Mood and Affect: Mood is anxious.     BP 108/82   Pulse 68   SpO2 97%  Wt Readings from Last 3 Encounters:  05/07/21 153 lb (69.4 kg)  04/23/21 153 lb (69.4 kg)  11/27/20 154 lb (69.9 kg)         Assessment & Plan:   Problem List Items Addressed This Visit   None Visit Diagnoses     Anxiety    -  Primary   Relevant Medications   sertraline (ZOLOFT) 50 MG tablet      Plan: Discussed management with SSRI/SNRI and possible side effects with each. Will start Zoloft 50 mg daily. Continue therapy. Has annual exam next month and will follow up with Dr. Dellis Filbert about how she is doing or sooner if needed.      Tamela Gammon DNP, 4:03 PM 12/13/2021

## 2022-01-25 ENCOUNTER — Encounter: Payer: Self-pay | Admitting: Obstetrics & Gynecology

## 2022-01-25 ENCOUNTER — Ambulatory Visit (INDEPENDENT_AMBULATORY_CARE_PROVIDER_SITE_OTHER): Payer: BC Managed Care – PPO | Admitting: Obstetrics & Gynecology

## 2022-01-25 VITALS — BP 102/58 | HR 64 | Resp 14 | Ht 66.25 in | Wt 155.0 lb

## 2022-01-25 DIAGNOSIS — Z01419 Encounter for gynecological examination (general) (routine) without abnormal findings: Secondary | ICD-10-CM | POA: Diagnosis not present

## 2022-01-25 DIAGNOSIS — F419 Anxiety disorder, unspecified: Secondary | ICD-10-CM

## 2022-01-25 DIAGNOSIS — Z30431 Encounter for routine checking of intrauterine contraceptive device: Secondary | ICD-10-CM

## 2022-01-25 MED ORDER — SERTRALINE HCL 50 MG PO TABS: 50.0000 mg | ORAL_TABLET | Freq: Every day | ORAL | 6 refills | Status: DC

## 2022-01-25 NOTE — Progress Notes (Signed)
Gina Valentine 1970-11-05 [id number]   History:    51 y.o. G2P1L1A1  Married.  Vasectomy.  Daughter is 19 years old and doing well.   RP:  Established patient presenting for annual gyn exam    HPI:  Well on Mirena IUD x 11/2020.  Menses are lighter and no BTB. No pelvic pain. No pain with intercourse.  Pap Neg 04/2019.  Will repeat at 3 years.  Breasts normal.  Screening mammo Neg 08/2021.  Urine and bowel movements normal. Colono 04/2021.  Body mass index 24.83. Physically active.  Health labs with family physician.    Past medical history,surgical history, family history and social history were all reviewed and documented in the EPIC chart.  Gynecologic History No LMP recorded. (Menstrual status: IUD).  Obstetric History OB History  Gravida Para Term Preterm AB Living  '2 1 1   1 1  '$ SAB IAB Ectopic Multiple Live Births  1            # Outcome Date GA Lbr Len/2nd Weight Sex Delivery Anes PTL Lv  2 SAB           1 Term              ROS: A ROS was performed and pertinent positives and negatives are included in the history. GENERAL: No fevers or chills. HEENT: No change in vision, no earache, sore throat or sinus congestion. NECK: No pain or stiffness. CARDIOVASCULAR: No chest pain or pressure. No palpitations. PULMONARY: No shortness of breath, cough or wheeze. GASTROINTESTINAL: No abdominal pain, nausea, vomiting or diarrhea, melena or bright red blood per rectum. GENITOURINARY: No urinary frequency, urgency, hesitancy or dysuria. MUSCULOSKELETAL: No joint or muscle pain, no back pain, no recent trauma. DERMATOLOGIC: No rash, no itching, no lesions. ENDOCRINE: No polyuria, polydipsia, no heat or cold intolerance. No recent change in weight. HEMATOLOGICAL: No anemia or easy bruising or bleeding. NEUROLOGIC: No headache, seizures, numbness, tingling or weakness. PSYCHIATRIC: No depression, no loss of interest in normal activity or change in sleep pattern.     Exam:   BP (!) 102/58    Pulse 64   Resp 14   Ht 5' 6.25" (1.683 m)   Wt 155 lb (70.3 kg)   BMI 24.83 kg/m   Body mass index is 24.83 kg/m.  General appearance : Well developed well nourished female. No acute distress HEENT: Eyes: no retinal hemorrhage or exudates,  Neck supple, trachea midline, no carotid bruits, no thyroidmegaly Lungs: Clear to auscultation, no rhonchi or wheezes, or rib retractions  Heart: Regular rate and rhythm, no murmurs or gallops Breast:Examined in sitting and supine position were symmetrical in appearance, no palpable masses or tenderness,  no skin retraction, no nipple inversion, no nipple discharge, no skin discoloration, no axillary or supraclavicular lymphadenopathy Abdomen: no palpable masses or tenderness, no rebound or guarding Extremities: no edema or skin discoloration or tenderness  Pelvic: Vulva: Normal             Vagina: No gross lesions or discharge  Cervix: No gross lesions or discharge.  IUD strings felt at the EO  Uterus  AV, normal size, shape and consistency, non-tender and mobile  Adnexa  Without masses or tenderness  Anus: Normal   Assessment/Plan:  51 y.o. female for annual exam   1. Well female exam with routine gynecological exam Well on Mirena IUD x 11/2020.  Menses are lighter and no BTB. No pelvic pain. No pain with intercourse.  Pap  Neg 04/2019.  Will repeat at 3 years.  Breasts normal.  Screening mammo Neg 08/2021.  Urine and bowel movements normal. Colono 04/2021.  Body mass index 24.83. Physically active.  Health labs with family physician.   2. Encounter for routine checking of intrauterine contraceptive device (IUD) Well on Mirena IUD x 11/2020.  Menses are lighter and no BTB. No pelvic pain. No pain with intercourse.  IUD in good position.  3. Anxiety Well on Zoloft 50 mg daily.  Still under many stressors.  Decision to continue on the same dose until next year. - sertraline (ZOLOFT) 50 MG tablet; Take 1 tablet (50 mg total) by mouth daily.    Princess Bruins MD, 3:56 PM

## 2022-02-09 ENCOUNTER — Ambulatory Visit: Payer: 59 | Admitting: Physician Assistant

## 2022-03-30 ENCOUNTER — Ambulatory Visit: Payer: BC Managed Care – PPO | Admitting: Neurology

## 2022-04-06 ENCOUNTER — Encounter: Payer: Self-pay | Admitting: Obstetrics & Gynecology

## 2022-04-06 DIAGNOSIS — F419 Anxiety disorder, unspecified: Secondary | ICD-10-CM

## 2022-04-21 NOTE — Telephone Encounter (Signed)
Per ML: "Yes, Wellbutrin XL 150 mg PO daily is a low dosage.  Usually it is increased to 300 mg daily after a few days.  I would stay on 150 if she is well on that dosage."

## 2022-04-21 NOTE — Telephone Encounter (Signed)
Per ML: "Agree to change Anti-Depressant form Zoloft to Effexor XL 150 mg PO daily.  Make sure patient has no suicidal ideation."

## 2022-04-22 MED ORDER — VENLAFAXINE HCL 75 MG PO TABS: 75.0000 mg | ORAL_TABLET | Freq: Two times a day (BID) | ORAL | 0 refills | Status: DC

## 2022-04-22 NOTE — Telephone Encounter (Signed)
Per ML: "Agree with Effexor XL 75 mg PO daily. Patient needs to establish with a Fam MD."

## 2022-05-23 ENCOUNTER — Ambulatory Visit: Payer: BC Managed Care – PPO | Admitting: Internal Medicine

## 2022-05-23 DIAGNOSIS — R002 Palpitations: Secondary | ICD-10-CM

## 2022-05-24 NOTE — Progress Notes (Signed)
Cancelled.  

## 2022-05-30 ENCOUNTER — Ambulatory Visit: Payer: BC Managed Care – PPO | Admitting: Cardiology

## 2022-06-13 ENCOUNTER — Other Ambulatory Visit: Payer: Self-pay | Admitting: Obstetrics & Gynecology

## 2022-06-13 DIAGNOSIS — F419 Anxiety disorder, unspecified: Secondary | ICD-10-CM

## 2022-06-14 NOTE — Telephone Encounter (Signed)
Med refill request: Effexor Last AEX: 01/25/22  Next AEX: 06/15/22 w JC tmr Last MMG (if hormonal med) 09/15/21 Refill authorized: Please Advise?

## 2022-06-15 ENCOUNTER — Encounter: Payer: Self-pay | Admitting: Radiology

## 2022-06-15 ENCOUNTER — Ambulatory Visit: Payer: BC Managed Care – PPO | Admitting: Radiology

## 2022-06-15 VITALS — BP 114/76 | Temp 98.6°F

## 2022-06-15 DIAGNOSIS — N6320 Unspecified lump in the left breast, unspecified quadrant: Secondary | ICD-10-CM | POA: Diagnosis not present

## 2022-06-15 DIAGNOSIS — N951 Menopausal and female climacteric states: Secondary | ICD-10-CM | POA: Diagnosis not present

## 2022-06-15 MED ORDER — ESTRADIOL 0.025 MG/24HR TD PTTW: 1.0000 | MEDICATED_PATCH | TRANSDERMAL | 2 refills | Status: DC

## 2022-06-15 NOTE — Progress Notes (Signed)
   Gina Valentine 05-10-1970 [id number]   History:  52 y.o. c/o left breast lump, nickel sized, tenderness, noticed area last week, now resolved. Also wants to discuss hormones, LMP: 3-4 months ago with Mirena (inserted 12/23/20), hot flashes, moodiness, night sweats, skin dryness, vaginal dryness.   P 04/24/19  M 09/15/21   Gynecologic History No LMP recorded. (Menstrual status: IUD).   Contraception/Family planning: IUD   Obstetric History OB History  Gravida Para Term Preterm AB Living  SAB IAB Ectopic Multiple Live Births  1            # Outcome Date GA Lbr Len/2nd Weight Sex Delivery Anes PTL Lv  2 SAB           1 Term              The following portions of the patient's history were reviewed and updated as appropriate: allergies, current medications, past family history, past medical history, past social history, past surgical history, and problem list.  Review of Systems Pertinent items noted in HPI and remainder of comprehensive ROS otherwise negative.   Past medical history, past surgical history, family history and social history were all reviewed and documented in the EPIC chart.   Exam:  Vitals:   06/15/22 1508  BP: 114/76  Temp: 98.6 F (37 C)  TempSrc: Oral   There is no height or weight on file to calculate BMI.  General appearance:  Normal Thyroid:  Symmetrical, normal in size, without palpable masses or nodularity. Respiratory  Auscultation:  Clear without wheezing or rhonchi Cardiovascular  Auscultation:  Regular rate, without rubs, murmurs or gallops  Edema/varicosities:  Not grossly evident Breasts: breasts appear normal, no suspicious masses, no skin or nipple changes or axillary nodes.   Patient informed chaperone available to be present for breast exam. Patient has requested no chaperone to be present.   Assessment/Plan:   1. Perimenopausal symptoms Open to HRT to manage, IUD for progesterone - estradiol (VIVELLE-DOT)  0.025 MG/24HR; Place 1 patch onto the skin 2 (two) times a week.  Dispense: 8 patch; Refill: 2  2. Mass of left breast, unspecified quadrant resolved   Follow up 3 months for med f/u AEX due 11/24  Lamaya Hyneman B WHNP-BC 3:45 PM 06/15/2022

## 2022-08-02 ENCOUNTER — Other Ambulatory Visit: Payer: Self-pay | Admitting: Radiology

## 2022-08-02 DIAGNOSIS — N951 Menopausal and female climacteric states: Secondary | ICD-10-CM

## 2022-08-02 MED ORDER — ESTRADIOL 0.05 MG/24HR TD PTTW: 1.0000 | MEDICATED_PATCH | TRANSDERMAL | 2 refills | Status: DC

## 2022-08-08 ENCOUNTER — Ambulatory Visit: Payer: BC Managed Care – PPO | Admitting: Cardiology

## 2022-08-08 ENCOUNTER — Encounter: Payer: Self-pay | Admitting: Cardiology

## 2022-08-08 ENCOUNTER — Telehealth: Payer: Self-pay

## 2022-08-08 VITALS — BP 116/78 | HR 66 | Ht 67.0 in | Wt 160.6 lb

## 2022-08-08 DIAGNOSIS — R002 Palpitations: Secondary | ICD-10-CM

## 2022-08-08 DIAGNOSIS — H8102 Meniere's disease, left ear: Secondary | ICD-10-CM

## 2022-08-08 DIAGNOSIS — G43809 Other migraine, not intractable, without status migrainosus: Secondary | ICD-10-CM

## 2022-08-08 NOTE — Telephone Encounter (Signed)
08/08/22 Patient stated that she would no be coming back. She said she will find someone that is in network with her insurance.

## 2022-08-08 NOTE — Progress Notes (Signed)
ID:  Gina Valentine, DOB November 22, 1970, MRN [id number]  PCP:  Jackelyn Poling, DO  Cardiologist:  Tessa Lerner, DO, [facility] (established care 08/08/22) Former Cardiology Providers: NA  REASON FOR CONSULT: Palpitations  REQUESTING PHYSICIAN:  Jackelyn Poling, DO [address] [place],  [place] [address]  Chief Complaint  Patient presents with   Palpitations   New Patient (Initial Visit)         HPI  Gina Valentine is a 52 y.o. Caucasian female who presents to the clinic for evaluation of palpitations at the request of Jackelyn Poling, DO. Her past medical history and cardiovascular risk factors include: Migraines, Mnire's disease.  Here for evaluation of palpitations. Symptom onset: 4 months ago. At rest heart rate is usually 50-60 bpm and randomly goes up. Symptoms last for <1-minute. As per her iWatch heart rate during those moments are between 150-179 bpm. Initially thought it may be secondary to perimenopausal symptoms. More noticeable in the afternoon/evening hours. Occurs at least 5 days a week. No improving or worsening factors. No new medications. Recently started wearing estrogen patch. Consumes no more than 2 cups of coffee per day. No excessive soda or alcohol consumption. She denies any new over-the-counter medications, herbal supplements, energy drinks, weight loss supplements. No near-syncope or syncopal events. No change in functional capacity.  She has a history of migraine and approximately 16 years ago had a bubble study which was negative according to the patient.  I do not have those records available for review.  FUNCTIONAL STATUS: Exercises 45 minutes to 1.5 hours on a regular basis she enjoys walking, going to the gym, swimming.  CARDIAC DATABASE: EKG: August 08, 2022: Sinus rhythm, 64 bpm, without underlying ischemia or injury pattern.  Echocardiogram: No results found for this or any previous visit from the past 1095 days.   Stress Testing: No  results found for this or any previous visit from the past 1095 days.    ALLERGIES: Allergies  Allergen Reactions   Azithromycin Other (See Comments)    MADE HEART RACE    MEDICATION LIST PRIOR TO VISIT: Current Meds  Medication Sig   ascorbic acid (VITAMIN C) 100 MG tablet Take by mouth.   calcium carbonate (SUPER CALCIUM) 1500 (600 Ca) MG TABS tablet Take by mouth.   cetirizine (ZYRTEC) 10 MG tablet Take by mouth.   Cholecalciferol 50 MCG (2000 UT) CAPS Take by mouth.   estradiol (VIVELLE-DOT) 0.05 MG/24HR patch Place 1 patch (0.05 mg total) onto the skin 2 (two) times a week.   Magnesium 125 MG CAPS daily.   Misc Natural Products (OSTEO BI-FLEX/5-LOXIN ADVANCED) TABS daily.   Multiple Vitamin (MULTI-VITAMIN PO) Take by mouth daily.   Omega-3 Fatty Acids (FISH OIL PO) Take by mouth daily.   SUMAtriptan (IMITREX) 100 MG tablet Take 100 mg by mouth every 2 (two) hours as needed.   triamterene-hydrochlorothiazide (DYAZIDE) 37.5-25 MG capsule Take 1 capsule by mouth daily.   venlafaxine (EFFEXOR) 75 MG tablet TAKE 1 TABLET(75 MG) BY MOUTH TWICE DAILY   zonisamide (ZONEGRAN) 25 MG capsule Take 100 mg by mouth daily.   [DISCONTINUED] ibuprofen (ADVIL) 200 MG tablet Take by mouth.     PAST MEDICAL HISTORY: Past Medical History:  Diagnosis Date   Allergy    seasonal   Anxiety    Arthritis    Cesarean delivery delivered    CIN I (cervical intraepithelial neoplasia I)    Heart murmur    Melasma    Menorrhagia  Migraine     PAST SURGICAL HISTORY: Past Surgical History:  Procedure Laterality Date   BREAST SURGERY     R BREAST FIBROADEMOMA   CESAREAN SECTION     2007   DILATION AND EVACUATION     1ST TRIMESTER MISSED AB   FOOT SURGERY Left    CYST REMOVED   TONSILLECTOMY AND ADENOIDECTOMY      FAMILY HISTORY: The patient family history includes Colon polyps in her father; Diabetes in her mother; Heart attack in her father; Heart disease in her maternal grandmother;  Hypertension in her mother; Stomach cancer in her father.  SOCIAL HISTORY:  The patient  reports that she has never smoked. She has never been exposed to tobacco smoke. She has never used smokeless tobacco. She reports current alcohol use. She reports that she does not use drugs.  REVIEW OF SYSTEMS: Review of Systems  Cardiovascular:  Negative for chest pain, claudication, dyspnea on exertion, irregular heartbeat, leg swelling, near-syncope, orthopnea, palpitations, paroxysmal nocturnal dyspnea and syncope.  Respiratory:  Negative for shortness of breath.   Hematologic/Lymphatic: Negative for bleeding problem.  Musculoskeletal:  Negative for muscle cramps and myalgias.  Neurological:  Negative for dizziness and light-headedness.    PHYSICAL EXAM:    08/08/2022    1:31 PM 06/15/2022    3:08 PM 01/25/2022    3:44 PM  Vitals with BMI  Height 5\' 7"   5' 6.25"  Weight 160 lbs 10 oz  155 lbs  BMI 25.15  24.82  Systolic 116 114 161  Diastolic 78 76 58  Pulse 66  64    Physical Exam  Constitutional: No distress.  Age appropriate, hemodynamically stable.   Neck: No JVD present.  Cardiovascular: Normal rate, regular rhythm, S1 normal, S2 normal, intact distal pulses and normal pulses. Exam reveals no gallop, no S3 and no S4.  No murmur heard. Pulmonary/Chest: Effort normal and breath sounds normal. No stridor. She has no wheezes. She has no rales.  Abdominal: Soft. Bowel sounds are normal. She exhibits no distension. There is no abdominal tenderness.  Musculoskeletal:        General: No edema.     Cervical back: Neck supple.  Neurological: She is alert and oriented to person, place, and time. She has intact cranial nerves (2-12).  Skin: Skin is warm and moist.    LABORATORY DATA: External Labs: Collected: March 31, 2022 provided by the PCP. Total cholesterol 163, triglycerides 93, HDL 64, calculated LDL 82, non-HDL 99. TSH 2.38. BUN 16, creatinine 0.79. Sodium 139, potassium  3.9, chloride 99, bicarb 29. AST 16, ALT 12, alkaline phosphatase 36. Hemoglobin 12.3, hematocrit 37.0%  IMPRESSION:    ICD-10-CM   1. Palpitations  R00.2 EKG 12-Lead    LONG TERM MONITOR (3-14 DAYS)    2. 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 Caucasian female whose past medical history and cardiac risk factors include: Migraine, Mnire's disease  Palpitations No identifiable reversible cause. EKG nonischemic without ectopy. 2-week extended Holter monitor to evaluate for dysrhythmias. Patient is made aware of Valsalva maneuver & splashing cold water if symptoms were to last longer than anticipated time. If she ever has a syncopal event she should go to the closest ER via EMS for further evaluation and management.  Data Reviewed: I have independently reviewed external notes provided by the referring provider as part of this  office visit.   I have independently reviewed results of EKG, labs as part of medical decision making. I have ordered the following tests:  Orders Placed This Encounter  Procedures   LONG TERM MONITOR (3-14 DAYS)    Standing Status:   Future    Order Specific Question:   Where should this test be performed?    Answer:   [facility]    Order Specific Question:   Does the patient have an implanted cardiac device?    Answer:   No    Order Specific Question:   Prescribed days of wear    Answer:   63    Order Specific Question:   Type of enrollment    Answer:   Clinic Enrollment   EKG 12-Lead   I have not made medications changes at today's encounter as noted above.  FINAL MEDICATION LIST END OF ENCOUNTER: No orders of the defined types were placed in this encounter.   Medications Discontinued During This Encounter  Medication Reason   fluticasone (FLONASE) 50 MCG/ACT nasal spray Completed Course   hydrOXYzine (ATARAX/VISTARIL) 25 MG tablet Completed Course    ibuprofen (ADVIL) 200 MG tablet Completed Course   methocarbamol (ROBAXIN) 750 MG tablet Completed Course     Current Outpatient Medications:    ascorbic acid (VITAMIN C) 100 MG tablet, Take by mouth., Disp: , Rfl:    calcium carbonate (SUPER CALCIUM) 1500 (600 Ca) MG TABS tablet, Take by mouth., Disp: , Rfl:    cetirizine (ZYRTEC) 10 MG tablet, Take by mouth., Disp: , Rfl:    Cholecalciferol 50 MCG (2000 UT) CAPS, Take by mouth., Disp: , Rfl:    estradiol (VIVELLE-DOT) 0.05 MG/24HR patch, Place 1 patch (0.05 mg total) onto the skin 2 (two) times a week., Disp: 8 patch, Rfl: 2   Magnesium 125 MG CAPS, daily., Disp: , Rfl:    Misc Natural Products (OSTEO BI-FLEX/5-LOXIN ADVANCED) TABS, daily., Disp: , Rfl:    Multiple Vitamin (MULTI-VITAMIN PO), Take by mouth daily., Disp: , Rfl:    Omega-3 Fatty Acids (FISH OIL PO), Take by mouth daily., Disp: , Rfl:    SUMAtriptan (IMITREX) 100 MG tablet, Take 100 mg by mouth every 2 (two) hours as needed., Disp: , Rfl:    triamterene-hydrochlorothiazide (DYAZIDE) 37.5-25 MG capsule, Take 1 capsule by mouth daily., Disp: , Rfl:    venlafaxine (EFFEXOR) 75 MG tablet, TAKE 1 TABLET(75 MG) BY MOUTH TWICE DAILY, Disp: 90 tablet, Rfl: 0   zonisamide (ZONEGRAN) 25 MG capsule, Take 100 mg by mouth daily., Disp: , Rfl:   Orders Placed This Encounter  Procedures   LONG TERM MONITOR (3-14 DAYS)   EKG 12-Lead    There are no Patient Instructions on file for this visit.   --Continue cardiac medications as reconciled in final medication list. --Return in about 8 weeks (around 10/03/2022) for Reevaluation of, Palpitations. or sooner if needed. --Continue follow-up with your primary care physician regarding the management of your other chronic comorbid conditions.  Patient's questions and concerns were addressed to her satisfaction. She voices understanding of the instructions provided during this encounter.   This note was created using a voice recognition software  as a result there may be grammatical errors inadvertently enclosed that do not reflect the nature of this encounter. Every attempt is made to correct such errors.  Tessa Lerner, [place], [facility]  Pager:  [phone] Office: [phone]

## 2022-09-13 ENCOUNTER — Other Ambulatory Visit: Payer: Self-pay | Admitting: Obstetrics & Gynecology

## 2022-09-13 DIAGNOSIS — F419 Anxiety disorder, unspecified: Secondary | ICD-10-CM

## 2022-09-13 NOTE — Telephone Encounter (Signed)
Med refill request: Effexor Last AEX: 01/25/22 Next OV: 09/20/22 Last MMG (if hormonal med) 09/15/21 Refill authorized: Please Advise, #90, 0 RF

## 2022-09-20 ENCOUNTER — Ambulatory Visit: Payer: BC Managed Care – PPO | Admitting: Radiology

## 2022-09-20 ENCOUNTER — Encounter: Payer: Self-pay | Admitting: Radiology

## 2022-09-20 VITALS — BP 104/70

## 2022-09-20 DIAGNOSIS — N951 Menopausal and female climacteric states: Secondary | ICD-10-CM

## 2022-09-20 MED ORDER — ESTRADIOL 0.075 MG/24HR TD PTTW: 1.0000 | MEDICATED_PATCH | TRANSDERMAL | 2 refills | Status: DC

## 2022-09-20 NOTE — Progress Notes (Signed)
   Gina Valentine [birth year] [id number]   History:  52 y.o. G2P1 presents for follow up after using vivelle patch x 3 mos, increased dose 6 weeks ago, has noticed some improvement. Still having hot flashes and trouble sleeping.   Gynecologic History No LMP recorded. (Menstrual status: IUD).   Contraception/Family planning: IUD Sexually active: yes   Obstetric History OB History  Gravida Para Term Preterm AB Living  2 1 1   1 1   SAB IAB Ectopic Multiple Live Births  1            # Outcome Date GA Lbr Len/2nd Weight Sex Type Anes PTL Lv  2 SAB           1 Term              The following portions of the patient's history were reviewed and updated as appropriate: allergies, current medications, past family history, past medical history, past social history, past surgical history, and problem list.  Review of Systems Pertinent items noted in HPI and remainder of comprehensive ROS otherwise negative.   Past medical history, past surgical history, family history and social history were all reviewed and documented in the EPIC chart.   Exam:  Vitals:   09/20/22 0938  BP: 104/70   There is no height or weight on file to calculate BMI.  Physical Exam Vitals and nursing note reviewed.  Constitutional:      Appearance: Normal appearance. She is normal weight.  Pulmonary:     Effort: Pulmonary effort is normal.  Neurological:     Mental Status: She is alert.  Psychiatric:        Mood and Affect: Mood normal.        Thought Content: Thought content normal.        Judgment: Judgment normal.      Assessment/Plan:   1. Perimenopausal symptoms Will increase to improve hot flashes Risks and benefits discussed Will contact office after 6-8 weeks to let me know how she feels on this dose If begins bleeding (has IUD) will add progesterone (also not sleeping well) - estradiol (VIVELLE-DOT) 0.075 MG/24HR; Place 1 patch onto the skin 2 (two) times a week.  Dispense: 8 patch;  Refill: 2     Marrio Scribner B WHNP-BC 9:54 AM 09/20/2022

## 2022-09-26 ENCOUNTER — Encounter: Payer: Self-pay | Admitting: Radiology

## 2022-10-07 ENCOUNTER — Telehealth: Payer: Self-pay

## 2022-10-07 ENCOUNTER — Other Ambulatory Visit: Payer: BC Managed Care – PPO

## 2022-10-07 DIAGNOSIS — R3915 Urgency of urination: Secondary | ICD-10-CM

## 2022-10-07 DIAGNOSIS — R300 Dysuria: Secondary | ICD-10-CM

## 2022-10-07 NOTE — Telephone Encounter (Signed)
Pt returned call. Cb and spoke w/ pt.   Pt reporting intermittent low abdominal cramping, urine odor, urgency and dysuria.  Denies vaginal sxs.  LMP: last week. BC-VAS  Ok for pt to come by this afternoon to leave urine sample?

## 2022-10-07 NOTE — Telephone Encounter (Signed)
Pt notified and voiced understanding. Lab appt made for 4pm. However, pt made aware to try to make it here asap. Sooner would be better due to Friday schedules being unpredictable and always a possibility for an early closure. Pt voiced understanding. Future order placed. Routing to provider for final review and closing.

## 2022-10-07 NOTE — Telephone Encounter (Signed)
Pt LVM in triage line inquiring/questioning if she has a UTI and requesting for cb.  LVMTCB

## 2022-10-07 NOTE — Telephone Encounter (Signed)
She is a Runner, broadcasting/film/video, doesn't get out until ~245. OV or lab appt only ok?

## 2022-10-07 NOTE — Telephone Encounter (Signed)
Yes I have a 1:30

## 2022-10-07 NOTE — Telephone Encounter (Signed)
Lab only ok

## 2022-10-08 ENCOUNTER — Encounter: Payer: Self-pay | Admitting: Obstetrics and Gynecology

## 2022-10-08 MED ORDER — NITROFURANTOIN MONOHYD MACRO 100 MG PO CAPS
100.0000 mg | ORAL_CAPSULE | Freq: Two times a day (BID) | ORAL | 1 refills | Status: DC
Start: 2022-10-08 — End: 2023-02-01

## 2022-10-08 NOTE — Progress Notes (Signed)
Returned call from answering service Confirmed ID [id number].   Patient complains of burning with urination, including terminal, dysuria, light pink tinged urine, lower abdominal cramping, fishy odor to urine, and generally not feeling well. Reports dropping off urine and clinic on Friday, and had not heard anything back. Patient has not checked temperature to confirm if she has had a fever, but says she feels chills. Review at UA results, to have blood and leukocytes. Will presumptively treat for urinary tract infection with Macrobid at this time. Reviewing symptoms that should prompt immediate evaluation, otherwise will follow up upon a results with her primary gyn provider during the week.

## 2022-10-11 ENCOUNTER — Other Ambulatory Visit: Payer: Self-pay

## 2022-10-11 NOTE — Progress Notes (Signed)
Opened in error

## 2022-11-16 MED ORDER — PROGESTERONE MICRONIZED 100 MG PO CAPS: 100.0000 mg | ORAL_CAPSULE | Freq: Every evening | ORAL | 0 refills | Status: DC

## 2022-11-16 NOTE — Telephone Encounter (Signed)
Spoke with patient. Reports bleeding now for 1 month, using menses cup, empties daily. Reports mild cramping in lower back for the last week, denies any urinary symptoms, vaginal odor or discharge. States she may have chills, is unsure. On Estradiol patch, working well for hot flashes. Has IUD, spouse vasectomy. Offered OV, patient declined at this time, would like Asher Muir to review and advise. Per review of 09/20/22 OV notes, if bleeding begins, will add progesterone. Patient states her life is busy and she just forgot to call, did not realize how ling bleeding has lasted. Advised will update Jami and f/u with recommendations. Patient agreeable.   Jami -please review and advise.

## 2022-11-16 NOTE — Telephone Encounter (Signed)
Sorry- read that too fast. Let's add progesterone 100mg  nightly #90. If the bleeding doesn't stop after 1 week schedule OV.

## 2022-11-16 NOTE — Telephone Encounter (Signed)
Spoke with patient. Patient states now is not a good time to talk, she will return call at later time to further discuss.

## 2022-11-16 NOTE — Telephone Encounter (Signed)
For clarification -Patient denies vaginal discharge or odor.

## 2022-11-16 NOTE — Telephone Encounter (Signed)
Spoke with patient, advised per Jami, patient verbalizes understanding and is agreeable. Rx sent to verified pharmacy.   Encounter closed.

## 2022-11-16 NOTE — Telephone Encounter (Signed)
Needs an OV because she is having cramping and d/c /odor.

## 2023-01-11 ENCOUNTER — Other Ambulatory Visit: Payer: Self-pay | Admitting: Radiology

## 2023-01-11 DIAGNOSIS — N951 Menopausal and female climacteric states: Secondary | ICD-10-CM

## 2023-01-11 NOTE — Telephone Encounter (Signed)
Med refill request: estradiol 0.075 mg patch Last AEX: 01/25/22 Next AEX: 02/01/23 Last MMG (if hormonal med) 09/19/22 BI-RADS 0 Last breast ultrasound 09/22/22 benign Refill authorized: estradiol 0.075 mg patch #8 no refills.  Sent to provider for review.

## 2023-01-13 ENCOUNTER — Telehealth: Payer: Self-pay

## 2023-01-13 NOTE — Telephone Encounter (Signed)
Pt LVM in triage line reporting sxs of suspected UTI and desired to come in to leave sample and received abx for tx. However, was advised no providers in office and would have to wait until Monday for an appt and is requesting a cb.   Pt reports burning w/ urination.   Pt advised that she can either go to UC over the weekend or can take OTC AZO for sxs until can come in for OV on Monday w/ UA/Ucx.   Pt scheduled for OV w/ Dr. Kennith Center on 01/16/2023 @ 415. However, pt inquiring if she has to see provider or if she can just drop of sample and still be prescribed abx based off initial UA results?   Please advise.

## 2023-01-13 NOTE — Telephone Encounter (Signed)
Per Dr. Kennith Center: "She needs to see a provider."

## 2023-01-16 ENCOUNTER — Ambulatory Visit: Payer: BC Managed Care – PPO | Admitting: Obstetrics and Gynecology

## 2023-01-16 NOTE — Telephone Encounter (Signed)
F/u with patient. Patient states she was seen at urgent care on Saturday and treated, OV no longer needed. OV cancelled for today at 4:15PM. Patient aware to call if any additional f/u needed.   Routing to provider for final review. Patient is agreeable to disposition. Will close encounter.

## 2023-01-17 ENCOUNTER — Other Ambulatory Visit: Payer: Self-pay | Admitting: Radiology

## 2023-01-17 DIAGNOSIS — F419 Anxiety disorder, unspecified: Secondary | ICD-10-CM

## 2023-01-17 NOTE — Telephone Encounter (Signed)
Med refill request: venlafaxine 75 mg Last AEX: 01/25/22 Next AEX: 02/01/23 Last MMG (if hormonal med) n/a Refill authorized: venlafaxine 75 mg #90.  Sent to provider for review.

## 2023-01-30 ENCOUNTER — Ambulatory Visit: Payer: BC Managed Care – PPO | Admitting: Obstetrics & Gynecology

## 2023-02-01 ENCOUNTER — Other Ambulatory Visit (HOSPITAL_COMMUNITY)
Admission: RE | Admit: 2023-02-01 | Discharge: 2023-02-01 | Disposition: A | Discharge status: Home / Self Care | Payer: BC Managed Care – PPO | Source: Ambulatory Visit | Attending: Radiology | Admitting: Radiology

## 2023-02-01 ENCOUNTER — Encounter: Payer: Self-pay | Admitting: Radiology

## 2023-02-01 ENCOUNTER — Ambulatory Visit (INDEPENDENT_AMBULATORY_CARE_PROVIDER_SITE_OTHER): Payer: BC Managed Care – PPO | Admitting: Radiology

## 2023-02-01 VITALS — BP 120/74 | HR 63 | Ht 66.0 in | Wt 161.0 lb

## 2023-02-01 DIAGNOSIS — F419 Anxiety disorder, unspecified: Secondary | ICD-10-CM | POA: Diagnosis not present

## 2023-02-01 DIAGNOSIS — N951 Menopausal and female climacteric states: Secondary | ICD-10-CM

## 2023-02-01 DIAGNOSIS — Z01419 Encounter for gynecological examination (general) (routine) without abnormal findings: Secondary | ICD-10-CM

## 2023-02-01 MED ORDER — VENLAFAXINE HCL 75 MG PO TABS
75.0000 mg | ORAL_TABLET | Freq: Every day | ORAL | 4 refills | Status: AC
Start: 2023-02-01 — End: ?

## 2023-02-01 MED ORDER — PROGESTERONE MICRONIZED 100 MG PO CAPS
100.0000 mg | ORAL_CAPSULE | Freq: Every evening | ORAL | 4 refills | Status: AC
Start: 2023-02-01 — End: ?

## 2023-02-01 MED ORDER — ESTRADIOL 0.075 MG/24HR TD PTTW: 1.0000 | MEDICATED_PATCH | TRANSDERMAL | 4 refills | Status: AC

## 2023-02-01 NOTE — Patient Instructions (Signed)
Preventive Care 40-50 Years Old, Female Preventive care refers to lifestyle choices and visits with your health care provider that can promote health and wellness. Preventive care visits are also called wellness exams. What can I expect for my preventive care visit? Counseling Your health care provider may ask you questions about your: Medical history, including: Past medical problems. Family medical history. Pregnancy history. Current health, including: Menstrual cycle. Method of birth control. Emotional well-being. Home life and relationship well-being. Sexual activity and sexual health. Lifestyle, including: Alcohol, nicotine or tobacco, and drug use. Access to firearms. Diet, exercise, and sleep habits. Work and work environment. Sunscreen use. Safety issues such as seatbelt and bike helmet use. Physical exam Your health care provider will check your: Height and weight. These may be used to calculate your BMI (body mass index). BMI is a measurement that tells if you are at a healthy weight. Waist circumference. This measures the distance around your waistline. This measurement also tells if you are at a healthy weight and may help predict your risk of certain diseases, such as type 2 diabetes and high blood pressure. Heart rate and blood pressure. Body temperature. Skin for abnormal spots. What immunizations do I need?  Vaccines are usually given at various ages, according to a schedule. Your health care provider will recommend vaccines for you based on your age, medical history, and lifestyle or other factors, such as travel or where you work. What tests do I need? Screening Your health care provider may recommend screening tests for certain conditions. This may include: Lipid and cholesterol levels. Diabetes screening. This is done by checking your blood sugar (glucose) after you have not eaten for a while (fasting). Pelvic exam and Pap test. Hepatitis B test. Hepatitis C  test. HIV (human immunodeficiency virus) test. STI (sexually transmitted infection) testing, if you are at risk. Lung cancer screening. Colorectal cancer screening. Mammogram. Talk with your health care provider about when you should start having regular mammograms. This may depend on whether you have a family history of breast cancer. BRCA-related cancer screening. This may be done if you have a family history of breast, ovarian, tubal, or peritoneal cancers. Bone density scan. This is done to screen for osteoporosis. Talk with your health care provider about your test results, treatment options, and if necessary, the need for more tests. Follow these instructions at home: Eating and drinking  Eat a diet that includes fresh fruits and vegetables, whole grains, lean protein, and low-fat dairy products. Take vitamin and mineral supplements as recommended by your health care provider. Do not drink alcohol if: Your health care provider tells you not to drink. You are pregnant, may be pregnant, or are planning to become pregnant. If you drink alcohol: Limit how much you have to 0-1 drink a day. Know how much alcohol is in your drink. In the [place] one drink equals one 12 oz bottle of beer (355 mL), one 5 oz glass of wine (148 mL), or one 1 oz glass of hard liquor (44 mL). Lifestyle Brush your teeth every morning and night with fluoride toothpaste. Floss one time each day. Exercise for at least 30 minutes 5 or more days each week. Do not use any products that contain nicotine or tobacco. These products include cigarettes, chewing tobacco, and vaping devices, such as e-cigarettes. If you need help quitting, ask your health care provider. Do not use drugs. If you are sexually active, practice safe sex. Use a condom or other form of protection to   prevent STIs. If you do not wish to become pregnant, use a form of birth control. If you plan to become pregnant, see your health care provider for a  prepregnancy visit. Take aspirin only as told by your health care provider. Make sure that you understand how much to take and what form to take. Work with your health care provider to find out whether it is safe and beneficial for you to take aspirin daily. Find healthy ways to manage stress, such as: Meditation, yoga, or listening to music. Journaling. Talking to a trusted person. Spending time with friends and family. Minimize exposure to UV radiation to reduce your risk of skin cancer. Safety Always wear your seat belt while driving or riding in a vehicle. Do not drive: If you have been drinking alcohol. Do not ride with someone who has been drinking. When you are tired or distracted. While texting. If you have been using any mind-altering substances or drugs. Wear a helmet and other protective equipment during sports activities. If you have firearms in your house, make sure you follow all gun safety procedures. Seek help if you have been physically or sexually abused. What's next? Visit your health care provider once a year for an annual wellness visit. Ask your health care provider how often you should have your eyes and teeth checked. Stay up to date on all vaccines. This information is not intended to replace advice given to you by your health care provider. Make sure you discuss any questions you have with your health care provider. Document Revised: 08/12/2020 Document Reviewed: 08/12/2020 Elsevier Patient Education  2024 [employer]  

## 2023-02-01 NOTE — Progress Notes (Signed)
Gina Valentine [birth year] [id number]   History:  52 y.o. G2P1 presents for annual exam.Doing well on HRT. Needs refills on that and effexor for anxiety.  Gynecologic History No LMP recorded. (Menstrual status: IUD).   Contraception/Family planning: IUD and tubal ligation Sexually active: yes Last Pap: 2021. Results were: normal, hx of CIN 1 Last mammogram: 09/22/22. Results were: normal Colonoscopy: 05/07/21  Obstetric History OB History  Gravida Para Term Preterm AB Living  2 1 1   1 1   SAB IAB Ectopic Multiple Live Births  1            # Outcome Date GA Lbr Len/2nd Weight Sex Type Anes PTL Lv  2 SAB           1 Term             The following portions of the patient's history were reviewed and updated as appropriate: allergies, current medications, past family history, past medical history, past social history, past surgical history, and problem list.  Review of Systems  All other systems reviewed and are negative.   Past medical history, past surgical history, family history and social history were all reviewed and documented in the EPIC chart.  Exam:  Vitals:   02/01/23 1526  BP: 120/74  Pulse: 63  SpO2: 96%  Weight: 161 lb (73 kg)  Height: 5\' 6"  (1.676 m)   Body mass index is 25.99 kg/m.  Physical Exam Vitals and nursing note reviewed. Exam conducted with a chaperone present.  Constitutional:      Appearance: Normal appearance. She is normal weight.  HENT:     Head: Normocephalic and atraumatic.  Neck:     Thyroid: No thyroid mass, thyromegaly or thyroid tenderness.  Cardiovascular:     Rate and Rhythm: Regular rhythm.     Heart sounds: Normal heart sounds.  Pulmonary:     Effort: Pulmonary effort is normal.     Breath sounds: Normal breath sounds.  Chest:  Breasts:    Breasts are symmetrical.     Right: Normal. No inverted nipple, mass, nipple discharge, skin change or tenderness.     Left: Normal. No inverted nipple, mass, nipple discharge,  skin change or tenderness.  Abdominal:     General: Abdomen is flat. Bowel sounds are normal.     Palpations: Abdomen is soft.  Genitourinary:    General: Normal vulva.     Vagina: Normal. No vaginal discharge, bleeding or lesions.     Cervix: Normal. No discharge or lesion.     Uterus: Normal. Not enlarged and not tender.      Adnexa: Right adnexa normal and left adnexa normal.       Right: No mass, tenderness or fullness.         Left: No mass, tenderness or fullness.    Lymphadenopathy:     Upper Body:     Right upper body: No axillary adenopathy.     Left upper body: No axillary adenopathy.  Skin:    General: Skin is warm and dry.  Neurological:     Mental Status: She is alert and oriented to person, place, and time.  Psychiatric:        Mood and Affect: Mood normal.        Thought Content: Thought content normal.        Judgment: Judgment normal.      Raynelle Fanning, CMA present for exam  Assessment/Plan:   1. Well woman exam with routine  gynecological exam - Cytology - PAP( [facility]) - Labs with PCP - Mammogram due 08/2023  2. Perimenopausal symptoms - estradiol (VIVELLE-DOT) 0.075 MG/24HR; Place 1 patch onto the skin 2 (two) times a week.  Dispense: 24 patch; Refill: 4 - progesterone (PROMETRIUM) 100 MG capsule; Take 1 capsule (100 mg total) by mouth at bedtime.  Dispense: 90 capsule; Refill: 4  3. Anxiety - venlafaxine (EFFEXOR) 75 MG tablet; Take 1 tablet (75 mg total) by mouth daily.  Dispense: 90 tablet; Refill: 4     Return in about 1 year (around 02/01/2024) for Annual.  Arlie Solomons B WHNP-BC 3:50 PM 02/01/2023

## 2023-02-03 LAB — CYTOLOGY - PAP
Adequacy: ABSENT
Comment: NEGATIVE
Diagnosis: NEGATIVE
High risk HPV: NEGATIVE

## 2023-09-25 LAB — HM MAMMOGRAPHY

## 2023-09-26 ENCOUNTER — Encounter: Payer: Self-pay | Admitting: Radiology

## 2023-11-29 ENCOUNTER — Telehealth: Payer: Self-pay | Admitting: *Deleted

## 2023-11-29 NOTE — Telephone Encounter (Signed)
 Call placed to patient, left detailed message, ok per dpr. Advised immunization on file are flu vaccine from 10/6/202 and Tdap 05/20/2014. If you need a copy of this you can stop by the office to request. Return call to office if you have any additional questions, [phone], opt 4.   Encounter closed.

## 2023-11-29 NOTE — Telephone Encounter (Signed)
 Patient left message requesting call back, states she is requesting immunization document Hx.

## 2024-02-16 ENCOUNTER — Other Ambulatory Visit: Payer: Self-pay | Admitting: Radiology

## 2024-02-16 DIAGNOSIS — N951 Menopausal and female climacteric states: Secondary | ICD-10-CM

## 2024-02-16 NOTE — Telephone Encounter (Signed)
 AEX scheduled for 04/11/23 at 1600

## 2024-02-16 NOTE — Telephone Encounter (Signed)
 Med refill request: progesterone  100 mg Last AEX: 02/01/23 Next AEX: none scheduled Last MMG (if hormonal med) 09/25/23 BI-RADS 2 benign Last refill: 11/15/2023 Refill authorized: progesterone  100 mg #30, needs office visit for further refills. Message sent to front desk to schedule AEX.

## 2024-03-18 ENCOUNTER — Other Ambulatory Visit: Payer: Self-pay | Admitting: Radiology

## 2024-03-18 DIAGNOSIS — N951 Menopausal and female climacteric states: Secondary | ICD-10-CM

## 2024-03-18 NOTE — Telephone Encounter (Signed)
 Med refill request: Progesterone  (Prometrium ) 100 mg capsult Last AEX: 02/01/23 JC Next AEX: 04/10/24 JC Last MMG (if hormonal med) 09/25/23 Refill authorized: Please Advise? Last Rx sent #30 with zero refills on 02/16/24 JC

## 2024-03-29 ENCOUNTER — Other Ambulatory Visit: Payer: Self-pay | Admitting: Radiology

## 2024-03-29 DIAGNOSIS — N951 Menopausal and female climacteric states: Secondary | ICD-10-CM

## 2024-04-10 ENCOUNTER — Ambulatory Visit: Admitting: Radiology
